# Patient Record
Sex: Male | Born: 1995 | Race: Black or African American | Hispanic: No | Marital: Single | Smoking: Never smoker
Health system: Southern US, Community
[De-identification: ages and names within clinical notes are randomized; demographics above are authoritative.]

## PROBLEM LIST (undated history)

## (undated) DIAGNOSIS — R569 Unspecified convulsions: Secondary | ICD-10-CM

---

## 2019-10-27 ENCOUNTER — Inpatient Hospital Stay
Admit: 2019-10-27 | Discharge: 2019-10-27 | Disposition: A | Discharge status: Home / Self Care | Attending: Emergency Medicine

## 2019-10-27 DIAGNOSIS — G40909 Epilepsy, unspecified, not intractable, without status epilepticus: Secondary | ICD-10-CM

## 2019-10-27 LAB — CBC WITH AUTO DIFFERENTIAL
Basophils %: 0 % (ref 0–1)
Basophils Absolute: 0 10*3/uL (ref 0.0–0.1)
Eosinophils %: 0 % (ref 0–7)
Eosinophils Absolute: 0 10*3/uL (ref 0.0–0.4)
Granulocyte Absolute Count: 0 10*3/uL (ref 0.00–0.04)
Hematocrit: 43.7 % (ref 36.6–50.3)
Hemoglobin: 13.7 g/dL (ref 12.1–17.0)
Immature Granulocytes: 0 % (ref 0.0–0.5)
Lymphocytes %: 7 % — ABNORMAL LOW (ref 12–49)
Lymphocytes Absolute: 0.7 10*3/uL — ABNORMAL LOW (ref 0.8–3.5)
MCH: 22.8 PG — ABNORMAL LOW (ref 26.0–34.0)
MCHC: 31.4 g/dL (ref 30.0–36.5)
MCV: 72.7 FL — ABNORMAL LOW (ref 80.0–99.0)
MPV: 10 FL (ref 8.9–12.9)
Monocytes %: 3 % — ABNORMAL LOW (ref 5–13)
Monocytes Absolute: 0.3 10*3/uL (ref 0.0–1.0)
NRBC Absolute: 0 10*3/uL (ref 0.00–0.01)
Neutrophils %: 90 % — ABNORMAL HIGH (ref 32–75)
Neutrophils Absolute: 8.7 10*3/uL — ABNORMAL HIGH (ref 1.8–8.0)
Nucleated RBCs: 0 PER 100 WBC
Platelets: 275 10*3/uL (ref 150–400)
RBC: 6.01 M/uL — ABNORMAL HIGH (ref 4.10–5.70)
RDW: 13.9 % (ref 11.5–14.5)
WBC: 9.7 10*3/uL (ref 4.1–11.1)

## 2019-10-27 LAB — COMPREHENSIVE METABOLIC PANEL
ALT: 18 U/L (ref 12–78)
AST: 16 U/L (ref 15–37)
Albumin/Globulin Ratio: 1 — ABNORMAL LOW (ref 1.1–2.2)
Albumin: 4.3 g/dL (ref 3.5–5.0)
Alkaline Phosphatase: 63 U/L (ref 45–117)
Anion Gap: 15 mmol/L (ref 5–15)
BUN: 8 MG/DL (ref 6–20)
Bun/Cre Ratio: 5 — ABNORMAL LOW (ref 12–20)
CO2: 23 mmol/L (ref 21–32)
Calcium: 8.9 MG/DL (ref 8.5–10.1)
Chloride: 101 mmol/L (ref 97–108)
Creatinine: 1.57 MG/DL — ABNORMAL HIGH (ref 0.70–1.30)
EGFR IF NonAfrican American: 54 mL/min/{1.73_m2} — ABNORMAL LOW (ref >60)
GFR African American: >60 mL/min/{1.73_m2} (ref >60)
Globulin: 4.1 g/dL — ABNORMAL HIGH (ref 2.0–4.0)
Glucose: 107 mg/dL — ABNORMAL HIGH (ref 65–100)
Potassium: 3.4 mmol/L — ABNORMAL LOW (ref 3.5–5.1)
Sodium: 139 mmol/L (ref 136–145)
Total Bilirubin: 0.4 MG/DL (ref 0.2–1.0)
Total Protein: 8.4 g/dL — ABNORMAL HIGH (ref 6.4–8.2)

## 2019-10-27 LAB — ETHYL ALCOHOL
ALCOHOL(ETHYL),SERUM: <10 MG/DL (ref <10)
Ethyl Alcohol: <10 MG/DL (ref <10)

## 2019-10-27 LAB — CBC WITH AUTOMATED DIFF
ABS. BASOPHILS: 0 10*3/uL (ref 0.0–0.1)
ABS. EOSINOPHILS: 0 10*3/uL (ref 0.0–0.4)
ABS. IMM. GRANS.: 0 10*3/uL (ref 0.00–0.04)
ABS. LYMPHOCYTES: 0.7 10*3/uL — ABNORMAL LOW (ref 0.8–3.5)
ABS. MONOCYTES: 0.3 10*3/uL (ref 0.0–1.0)
ABS. NEUTROPHILS: 8.7 10*3/uL — ABNORMAL HIGH (ref 1.8–8.0)
ABSOLUTE NRBC: 0 10*3/uL (ref 0.00–0.01)
BASOPHILS: 0 % (ref 0–1)
EOSINOPHILS: 0 % (ref 0–7)
HCT: 43.7 % (ref 36.6–50.3)
HGB: 13.7 g/dL (ref 12.1–17.0)
IMMATURE GRANULOCYTES: 0 % (ref 0.0–0.5)
LYMPHOCYTES: 7 % — ABNORMAL LOW (ref 12–49)
MCH: 22.8 PG — ABNORMAL LOW (ref 26.0–34.0)
MCHC: 31.4 g/dL (ref 30.0–36.5)
MCV: 72.7 FL — ABNORMAL LOW (ref 80.0–99.0)
MONOCYTES: 3 % — ABNORMAL LOW (ref 5–13)
MPV: 10 FL (ref 8.9–12.9)
NEUTROPHILS: 90 % — ABNORMAL HIGH (ref 32–75)
NRBC: 0 PER 100 WBC
PLATELET: 275 10*3/uL (ref 150–400)
RBC: 6.01 M/uL — ABNORMAL HIGH (ref 4.10–5.70)
RDW: 13.9 % (ref 11.5–14.5)
WBC: 9.7 10*3/uL (ref 4.1–11.1)

## 2019-10-27 LAB — METABOLIC PANEL, COMPREHENSIVE
A-G Ratio: 1 — ABNORMAL LOW (ref 1.1–2.2)
ALT (SGPT): 18 U/L (ref 12–78)
AST (SGOT): 16 U/L (ref 15–37)
Albumin: 4.3 g/dL (ref 3.5–5.0)
Alk. phosphatase: 63 U/L (ref 45–117)
Anion gap: 15 mmol/L (ref 5–15)
BUN/Creatinine ratio: 5 — ABNORMAL LOW (ref 12–20)
BUN: 8 MG/DL (ref 6–20)
Bilirubin, total: 0.4 MG/DL (ref 0.2–1.0)
CO2: 23 mmol/L (ref 21–32)
Calcium: 8.9 MG/DL (ref 8.5–10.1)
Chloride: 101 mmol/L (ref 97–108)
Creatinine: 1.57 MG/DL — ABNORMAL HIGH (ref 0.70–1.30)
GFR est AA: >60 mL/min/{1.73_m2} (ref >60)
GFR est non-AA: 54 mL/min/{1.73_m2} — ABNORMAL LOW (ref >60)
Globulin: 4.1 g/dL — ABNORMAL HIGH (ref 2.0–4.0)
Glucose: 107 mg/dL — ABNORMAL HIGH (ref 65–100)
Potassium: 3.4 mmol/L — ABNORMAL LOW (ref 3.5–5.1)
Protein, total: 8.4 g/dL — ABNORMAL HIGH (ref 6.4–8.2)
Sodium: 139 mmol/L (ref 136–145)

## 2019-10-27 MED ORDER — POTASSIUM CHLORIDE SR 10 MEQ TAB
10 mEq | ORAL | Status: AC
Start: 2019-10-27 — End: 2019-10-27
  Administered 2019-10-27: 21:00:00 via ORAL

## 2019-10-27 MED ORDER — SODIUM CHLORIDE 0.9 % IV
5005 mg/5 mL | Freq: Once | INTRAVENOUS | Status: AC
Start: 2019-10-27 — End: 2019-10-27
  Administered 2019-10-27: 19:00:00 via INTRAVENOUS

## 2019-10-27 MED ORDER — LEVETIRACETAM 500 MG/5 ML IV SOLN
500 mg/5 mL | Freq: Once | INTRAVENOUS | Status: DC
Start: 2019-10-27 — End: 2019-10-27

## 2019-10-27 MED ORDER — LEVETIRACETAM 500 MG TAB
500 mg | ORAL_TABLET | Freq: Two times a day (BID) | ORAL | 0 refills | Status: AC
Start: 2019-10-27 — End: 2019-11-26

## 2019-10-27 MED ORDER — SODIUM CHLORIDE 0.9% BOLUS IV
0.9 % | Freq: Once | INTRAVENOUS | Status: AC
Start: 2019-10-27 — End: 2019-10-27
  Administered 2019-10-27: 19:00:00 via INTRAVENOUS

## 2019-10-27 MED FILL — LEVETIRACETAM 500 MG/5 ML IV SOLN: 500 mg/5 mL | INTRAVENOUS | Qty: 10

## 2019-10-27 MED FILL — SODIUM CHLORIDE 0.9 % IV: INTRAVENOUS | Qty: 1000

## 2019-10-27 MED FILL — POTASSIUM CHLORIDE SR 10 MEQ TAB: 10 mEq | ORAL | Qty: 4

## 2019-10-27 NOTE — ED Provider Notes (Signed)
EMERGENCY DEPARTMENT HISTORY AND PHYSICAL EXAM      Please note that this dictation was completed with the assistance of "Dragon", the computer voice recognition software. Quite often unanticipated grammatical, syntax, homophones, and other interpretive errors are inadvertently transcribed by the computer software. Please disregard these errors and any errors that have escaped final proofreading. Thank you.    Patient Name: Samuel Rosario  DOB: [birth year]  MRN: [id number]  History of Presenting Illness     Chief Complaint   Patient presents with   ??? Seizure       History Provided By: Patient and EMS    HPI: Samuel Rosario, 23 y.o. male with past medical history as documented below presents to the ED with c/o of recurrent seizures prior to arrival.  According to EMS and his girlfriend, patient had 4 seizures today.  Patient's girlfriend did witness all 4 seizures but able to tell me how long each seizure lasted.  Patient apparently had generalized tonic-clonic seizures.  Patient did have tongue biting but no urinary incontinence.  Patient did have a post ictal state after each seizure.  According to the girlfriend, patient is possibly on antiepileptics but has been noncompliant with that.  Patient reports no complaints currently.  EMS vitals were normal, blood sugar was 149.  Patient denies any recent fever, illnesses. He states his [facility]rolog[facility] at the [facility] and [facility]. He thinks he is supposed to be taking Keppra but he states he never got a Rx for it. Pt denies any other alleviating or exacerbating factors. Additionally, pt specifically denies any recent fever, chills, headache, nausea, vomiting, abdominal pain, CP, SOB, lightheadedness, dizziness, numbness, weakness, lower extremity swelling, heart palpitations, urinary sxs, diarrhea, constipation, melena, hematochezia, cough, or congestion.     There are no other complaints, changes or physical findings pertinent to the HPI at this time.    PCP: None     Past History   Past Medical History:  Seizure disorder    Past Surgical History:  History reviewed. No pertinent surgical history.    Family History:  History reviewed. No pertinent family history.    Social History:  Social History     Tobacco Use   ??? Smoking status: Current Every Day Smoker   ??? Smokeless tobacco: Never Used   Substance Use Topics   ??? Alcohol use: Not Currently   ??? Drug use: Not Currently       Allergies:  No Known Allergies    Current Medications:  No current facility-administered medications on file prior to encounter.      No current outpatient medications on file prior to encounter.     Review of Systems   Review of Systems   Constitutional: Negative.  Negative for chills and fever.   HENT: Negative.  Negative for congestion, facial swelling, rhinorrhea, sore throat, trouble swallowing and voice change.    Eyes: Negative.    Respiratory: Negative.  Negative for apnea, cough, chest tightness, shortness of breath and wheezing.    Cardiovascular: Negative.  Negative for chest pain, palpitations and leg swelling.   Gastrointestinal: Negative.  Negative for abdominal distention, abdominal pain, blood in stool, constipation, diarrhea, nausea and vomiting.   Endocrine: Negative.  Negative for cold intolerance, heat intolerance and polyuria.   Genitourinary: Negative.  Negative for difficulty urinating, dysuria, flank pain, frequency, hematuria and urgency.   Musculoskeletal: Negative.  Negative for arthralgias, back pain, myalgias, neck pain and neck stiffness.   Skin: Negative.  Negative for color  change and rash.   Neurological: Positive for seizures. Negative for dizziness, syncope, facial asymmetry, speech difficulty, weakness, light-headedness, numbness and headaches.   Hematological: Negative.  Does not bruise/bleed easily.   Psychiatric/Behavioral: Negative.  Negative for confusion and self-injury. The patient is not nervous/anxious.        Physical Exam   Physical Exam   Vitals signs and nursing note reviewed.   Constitutional:       Appearance: He is well-developed. He is not toxic-appearing.   HENT:      Head: Normocephalic and atraumatic.      Comments: Right lateral tongue ecchymosis, no active bleeding     Mouth/Throat:      Pharynx: No posterior oropharyngeal erythema.   Eyes:      Conjunctiva/sclera: Conjunctivae normal.      Pupils: Pupils are equal, round, and reactive to light.   Neck:      Musculoskeletal: Normal range of motion.   Cardiovascular:      Rate and Rhythm: Normal rate and regular rhythm.      Heart sounds: Normal heart sounds. No murmur. No friction rub. No gallop.    Pulmonary:      Effort: Pulmonary effort is normal. No respiratory distress.      Breath sounds: Normal breath sounds. No wheezing or rales.   Chest:      Chest wall: No tenderness.   Abdominal:      General: Bowel sounds are normal. There is no distension.      Palpations: Abdomen is soft. There is no mass.      Tenderness: There is no abdominal tenderness. There is no guarding or rebound.   Musculoskeletal: Normal range of motion.         General: No tenderness or deformity.   Skin:     General: Skin is warm.      Findings: No rash.   Neurological:      Mental Status: He is alert and oriented to person, place, and time.      Cranial Nerves: No cranial nerve deficit.      Motor: No abnormal muscle tone.      Coordination: Coordination normal.      Deep Tendon Reflexes: Reflexes normal.   Psychiatric:         Behavior: Behavior is cooperative.         Diagnostic Study Results     Labs -   I have personally reviewed and interpreted all laboratory results.   Recent Results (from the past 24 hour(s))   CBC WITH AUTOMATED DIFF    Collection Time: 10/27/19  1:50 PM   Result Value Ref Range    WBC 9.7 4.1 - 11.1 K/uL    RBC 6.01 (H) 4.10 - 5.70 M/uL    HGB 13.7 12.1 - 17.0 g/dL    HCT 43.7 36.6 - 50.3 %    MCV 72.7 (L) 80.0 - 99.0 FL    MCH 22.8 (L) 26.0 - 34.0 PG    MCHC 31.4 30.0 - 36.5 g/dL     RDW 13.9 11.5 - 14.5 %    PLATELET 275 150 - 400 K/uL    MPV 10.0 8.9 - 12.9 FL    NRBC 0.0 0 PER 100 WBC    ABSOLUTE NRBC 0.00 0.00 - 0.01 K/uL    NEUTROPHILS 90 (H) 32 - 75 %    LYMPHOCYTES 7 (L) 12 - 49 %    MONOCYTES 3 (L) 5 - 13 %  EOSINOPHILS 0 0 - 7 %    BASOPHILS 0 0 - 1 %    IMMATURE GRANULOCYTES 0 0.0 - 0.5 %    ABS. NEUTROPHILS 8.7 (H) 1.8 - 8.0 K/UL    ABS. LYMPHOCYTES 0.7 (L) 0.8 - 3.5 K/UL    ABS. MONOCYTES 0.3 0.0 - 1.0 K/UL    ABS. EOSINOPHILS 0.0 0.0 - 0.4 K/UL    ABS. BASOPHILS 0.0 0.0 - 0.1 K/UL    ABS. IMM. GRANS. 0.0 0.00 - 0.04 K/UL    DF SMEAR SCANNED      RBC COMMENTS NORMOCYTIC, NORMOCHROMIC     METABOLIC PANEL, COMPREHENSIVE    Collection Time: 10/27/19  1:50 PM   Result Value Ref Range    Sodium 139 136 - 145 mmol/L    Potassium 3.4 (L) 3.5 - 5.1 mmol/L    Chloride 101 97 - 108 mmol/L    CO2 23 21 - 32 mmol/L    Anion gap 15 5 - 15 mmol/L    Glucose 107 (H) 65 - 100 mg/dL    BUN 8 6 - 20 MG/DL    Creatinine 1.57 (H) 0.70 - 1.30 MG/DL    BUN/Creatinine ratio 5 (L) 12 - 20      GFR est AA >60 >60 ml/min/1.28m    GFR est non-AA 54 (L) >60 ml/min/1.753m   Calcium 8.9 8.5 - 10.1 MG/DL    Bilirubin, total 0.4 0.2 - 1.0 MG/DL    ALT (SGPT) 18 12 - 78 U/L    AST (SGOT) 16 15 - 37 U/L    Alk. phosphatase 63 45 - 117 U/L    Protein, total 8.4 (H) 6.4 - 8.2 g/dL    Albumin 4.3 3.5 - 5.0 g/dL    Globulin 4.1 (H) 2.0 - 4.0 g/dL    A-G Ratio 1.0 (L) 1.1 - 2.2     ETHYL ALCOHOL    Collection Time: 10/27/19  2:03 PM   Result Value Ref Range    ALCOHOL(ETHYL),SERUM <10 <10 MG/DL       Radiologic Studies -   I have personally reviewed and interpreted all imaging studies and agree with radiology interpretation and report.   No orders to display     CT Results  (Last 48 hours)    None        CXR Results  (Last 48 hours)    None          Medical Decision Making   I reviewed the vital signs, available nursing notes, past medical history, past surgical history, family history and social history.     Vital Signs-Reviewed the patient's vital signs.  Patient Vitals for the past 24 hrs:   Temp Pulse Resp BP SpO2   10/27/19 1353 98.4 ??F (36.9 ??C) 81 16 119/71 97 %       Pulse Oximetry Analysis - 97% on RA    Cardiac Monitor:   Rate: 81 bpm  Rhythm: Normal Sinus Rhythm      Records Reviewed: Nursing Notes, Old Medical Records, Previous electrocardiograms, Previous Radiology Studies and Previous Laboratory Studies    Provider Notes (Medical Decision Making):   Patient presents after a seizure. Currently stable vitals, GCS 15 and no longer post-ictal. DDx: seizure 2/2 noncompliance, infection, increased stressor, electrolyte anomaly (hypoglycemia, etc), ETOH withdrawal. Less likely related to meningitis or brain mass at this time.  Will obtain UA, labs and provide loading dose of antiepileptic.    Given seizure, I did discuss  with the patient about driving restrictions and that he is not allowed to drive for 6 months according to the [facility].    ED Course:   I am the first provider for this patient's ED visit today.  Initial assessment performed. I discussed presenting problems, concerns and my formulated plan for today's visit with the patient and any available family members at bedside. I encouraged them to ask questions as they arise throughout the visit.     TOBACCO COUNSELING:  Upon evaluation, pt expressed that they are a current tobacco user. For approximately 10 minutes, pt has been counseled on the dangers of smoking and was encouraged to quit as soon as possible in order to decrease further risks to their health. Pt has conveyed their understanding of the risks involved should they continue to use tobacco products.    I reviewed our electronic medical record system for any past medical records that were available that may contribute to the patient's current condition, the nursing notes and vital signs from today's visit.  Roena Malady, MD    ED Orders Placed :  Orders Placed This Encounter    ??? CBC WITH AUTOMATED DIFF   ??? METABOLIC PANEL, COMPREHENSIVE   ??? DRUG SCREEN, URINE   ??? BLOOD ALCOHOL (Ethyl Alcohol)   ??? CARDIAC/RESPIRATORY MONITORING   ??? INSERT PERIPHERAL IV ONE TIME STAT   ??? SEIZURE PRECAUTIONS   ??? DISCONTD: levETIRAcetam (KEPPRA) 1,000 mg in 0.9% sodium chloride 100 mL IVPB   ??? sodium chloride 0.9 % bolus infusion 1,000 mL   ??? levETIRAcetam (KEPPRA) 1,000 mg in 0.9% sodium chloride 100 mL IVPB   ??? potassium chloride SR (KLOR-CON 10) tablet 40 mEq   ??? levETIRAcetam (Keppra) 500 mg tablet       ED Medications Administered:  Medications   potassium chloride SR (KLOR-CON 10) tablet 40 mEq (has no administration in time range)   sodium chloride 0.9 % bolus infusion 1,000 mL (1,000 mL IntraVENous New Bag 10/27/19 1413)   levETIRAcetam (KEPPRA) 1,000 mg in 0.9% sodium chloride 100 mL IVPB (0 mg IntraVENous IV Completed 10/27/19 1512)        Progress Note:  Patient has been reassessed and reports feeling better and symptoms have improved significantly after ED treatment. Samuel Rosario's final labs and imaging have been reviewed with him and available family and/or caregiver. They have been counseled regarding his diagnosis. He verbally conveys understanding and agreement of the signs, symptoms, diagnosis, treatment and prognosis and additionally agrees to follow up as recommended with Dr. None and/or specialist in 24 - 48 hours. He also agrees with the care-plan we created together and conveys that all of his questions have been answered.  I have also put together some discharge instructions for him that include: 1) educational information regarding their diagnosis, 2) how to care for their diagnosis at home, as well a 3) list of reasons why they would want to return to the ED prior to their follow-up appointment should the patient's condition change or symptoms worsen.    I have answered all questions to the patient's satisfaction. Strict return  precautions given. He both understood and agreed with plan as discussed. Vital signs stable for discharge. Pt very appreciative of care today.     Disposition: Discharge  The pt is ready for discharge. The pt's signs, symptoms, diagnosis, and discharge instructions have been discussed and pt and/or family have conveyed their understanding. The pt is to follow up as recommended or return to  ER should their symptoms worsen. Plan has been discussed and all parties are in full agreement.    Plan:  1. Return precautions as discussed with patient and available family and/or caregiver.     2.   Current Discharge Medication List      START taking these medications    Details   levETIRAcetam (Keppra) 500 mg tablet Take 1 Tab by mouth two (2) times a day for 30 days.  Qty: 60 Tab, Refills: 0           3.   Follow-up Information     Follow up With Specialties Details [place]    [facility] EMERGENCY DEPT Emergency Medicine  As needed, If symptoms worsen [address]    [facility]  Schedule an appointment as soon as possible for a visit As needed, If symptoms worsen [place]  [place]  [phone]          Instructed to return to ED if worse  Diagnosis   Clinical Impression:  1. Breakthrough seizure ([place])    2. AKI (acute kidney injury) ([place])    3. Medically noncompliant    4. Acute hypokalemia    5. Injury of tongue, initial encounter        Attestation:  I, Fredderick Erb, MD, am the attending of record for this patient. I personally performed the services described in this documentation on this date, 10/27/2019 for patient, Samuel Rosario. I have reviewed the chart and verified that the record is accurate and complete.

## 2019-10-27 NOTE — ED Notes (Signed)
Pt escorted to family friend's car in wheelchair by ED staff.

## 2019-10-27 NOTE — ED Notes (Signed)
Pt presents to ED via EMS complaining of seizure. Pt noted to be restless in bed, does not remember having a seizure, reports headache. Pt denies drug or alcohol use. Pt reports he is not currently taking medications for seizures, reports his last seizure was 2-3 weeks ago. Pt has significant other at bedside. She reports pt had a total of 4 seizures today. Two while he was still in bed, one while standing where she assisted him back to bed. She reports he had another fall in the bathroom that she did not witness. Pt has RR even and unlabored, skin is warm and dry. Assessment completed and pt updated on plan of care.  Call bell in reach, seizure precautions in place, urinal in reach.       Emergency Department Nursing Plan of Care       The Nursing Plan of Care is developed from the Nursing assessment and Emergency Department Attending provider initial evaluation.  The plan of care may be reviewed in the ???ED Provider note???.    The Plan of Care was developed with the following considerations:   Patient / Family readiness to learn indicated by:verbalized understanding  Persons(s) to be included in education: patient and family  Barriers to Learning/Limitations:No    Signed     Amanda N Taylor, RN    10/27/2019   2:23 PM

## 2019-10-27 NOTE — ED Notes (Addendum)
Rn in to discharge pt. Pt visitor noted to be gone. Pt still appears drowsy, reports he will call his girlfriend to come pick him up. Fluids continue to infuse. Pt has no questions at this time. Seizure precautions remain in place, call bell in reach

## 2019-10-27 NOTE — ED Triage Notes (Signed)
CC seizure activity today, typically a patient of [facility], post-ictal on arrival but is alert and answering questions appropriately. Seizure pads placed, placed on monitor. Pt c/o head pain.

## 2019-10-27 NOTE — ED Notes (Signed)
..  Discharge summary and discharge medications reviewed with patient and appropriate educational materials and side effects teaching were provided.  patient  Given 1 paper prescriptions and 0 electronic prescriptions sent to pt's listed pharmacy.Patient verbalized understanding of the importance of discussing medications with his or her physician or clinic they will be following up with. No si/s of acute distress prior to discharge.Patient offered wheelchair from treatment area to hospital entrance, patient discharged with wheelchair.

## 2019-10-27 NOTE — ED Provider Notes (Signed)
ED  Provider Notes by Fredderick Erb, MD at 10/27/19 1354                Author: Fredderick Erb, MD  Service: Emergency Medicine  Author Type: Physician       Filed: 10/28/19 2035  Date of Service: 10/27/19 1354  Status: Signed          Editor: Fredderick Erb, MD (Physician)                EMERGENCY DEPARTMENT HISTORY AND PHYSICAL EXAM           Please note that this dictation was completed with the assistance of "Dragon", the computer voice recognition software. Quite often unanticipated grammatical, syntax, homophones, and other interpretive  errors are inadvertently transcribed by the computer software. Please disregard these errors and any errors that have escaped final proofreading. Thank you.      Patient Name: Samuel Rosario   DOB: Jan 01, 1996   MRN: [id number]     History of Presenting Illness          Chief Complaint       Patient presents with        ?  Seizure           History Provided By: Patient and EMS      HPI: Samuel Rosario,  23 y.o. male with past medical history as documented below presents to the ED with c/o of  recurrent seizures prior to arrival.  According to EMS and his girlfriend, patient had 4 seizures today.  Patient's girlfriend did witness all 4 seizures but able to tell me how long each seizure lasted.  Patient apparently had generalized tonic-clonic  seizures.  Patient did have tongue biting but no urinary incontinence.  Patient did have a post ictal state after each seizure.  According to the girlfriend, patient is possibly on antiepileptics but has been noncompliant with that.  Patient reports no  complaints currently.  EMS vitals were normal, blood sugar was 149.  Patient denies any recent fever, illnesses. He states his Neurologist is at [facility] and [facility]. He thinks he is supposed to be taking Keppra but he states he never got a Rx  for it. Pt denies any other alleviating or exacerbating factors. Additionally, pt specifically denies any recent fever, chills, headache, nausea,  vomiting, abdominal pain, CP, SOB, lightheadedness, dizziness, numbness, weakness, lower extremity swelling,  heart palpitations, urinary sxs, diarrhea, constipation, melena, hematochezia, cough, or congestion.       There are no other complaints, changes or physical findings pertinent to the HPI at this time.      PCP: None        Past History     Past Medical History:   Seizure disorder      Past Surgical History:   History reviewed. No pertinent surgical history.      Family History:   History reviewed. No pertinent family history.      Social History:     Social History          Tobacco Use         ?  Smoking status:  Current Every Day Smoker     ?  Smokeless tobacco:  Never Used       Substance Use Topics         ?  Alcohol use:  Not Currently         ?  Drug use:  Not Currently  Allergies:   No Known Allergies      Current Medications:     No current facility-administered medications on file prior to encounter.           No current outpatient medications on file prior to encounter.          Review of Systems     Review of Systems    Constitutional: Negative.  Negative for chills and fever.    HENT: Negative.  Negative for congestion, facial swelling, rhinorrhea, sore throat, trouble swallowing and voice change.     Eyes: Negative.     Respiratory: Negative.  Negative for apnea, cough, chest tightness, shortness of breath and wheezing.     Cardiovascular: Negative.  Negative for chest pain, palpitations and leg swelling.    Gastrointestinal: Negative.  Negative for abdominal distention, abdominal pain, blood in stool, constipation, diarrhea, nausea and vomiting.    Endocrine: Negative.  Negative for cold intolerance, heat intolerance and polyuria.    Genitourinary: Negative.  Negative for difficulty urinating, dysuria, flank pain, frequency, hematuria and urgency.    Musculoskeletal: Negative.  Negative for arthralgias, back pain, myalgias, neck pain and neck stiffness.    Skin: Negative.  Negative for  color change and rash.    Neurological: Positive for seizures. Negative for dizziness, syncope, facial asymmetry, speech difficulty, weakness, light-headedness, numbness  and headaches.    Hematological: Negative.  Does not bruise/bleed easily.    Psychiatric/Behavioral: Negative.  Negative for confusion and self-injury. The patient is not nervous/anxious.             Physical Exam     Physical Exam   Vitals signs and nursing note reviewed.   Constitutional:        Appearance: He is well-developed. He is not toxic-appearing.    HENT:       Head: Normocephalic and atraumatic.      Comments: Right lateral tongue ecchymosis, no active bleeding     Mouth/Throat:       Pharynx: No posterior oropharyngeal erythema.   Eyes :       Conjunctiva/sclera: Conjunctivae normal.      Pupils: Pupils are equal, round, and reactive to light.    Neck:       Musculoskeletal: Normal range of motion.   Cardiovascular :       Rate and Rhythm: Normal rate and regular rhythm.      Heart sounds: Normal heart sounds. No murmur. No friction rub. No gallop.     Pulmonary:       Effort: Pulmonary effort is normal. No respiratory distress.      Breath sounds: Normal breath sounds. No wheezing or rales.   Chest:       Chest wall: No tenderness.   Abdominal :      General: Bowel sounds are normal. There is no distension.      Palpations: Abdomen is soft. There is no mass.      Tenderness: There is no abdominal tenderness. There is no guarding or rebound.     Musculoskeletal: Normal range of motion.          General: No tenderness or deformity.    Skin:      General: Skin is warm.      Findings: No rash.   Neurological :       Mental Status: He is alert and oriented to person, place, and time.      Cranial Nerves: No cranial nerve deficit.  Motor: No abnormal muscle tone.      Coordination: Coordination normal.      Deep Tendon Reflexes: Reflexes normal.   Psychiatric:          Behavior: Behavior is cooperative.               Diagnostic Study  Results        Labs -    I have personally reviewed and interpreted all laboratory results.      Recent Results (from the past 24 hour(s))     CBC WITH AUTOMATED DIFF          Collection Time: 10/27/19  1:50 PM         Result  Value  Ref Range            WBC  9.7  4.1 - 11.1 K/uL       RBC  6.01 (H)  4.10 - 5.70 M/uL       HGB  13.7  12.1 - 17.0 g/dL       HCT  43.7  36.6 - 50.3 %       MCV  72.7 (L)  80.0 - 99.0 FL       MCH  22.8 (L)  26.0 - 34.0 PG       MCHC  31.4  30.0 - 36.5 g/dL       RDW  13.9  11.5 - 14.5 %       PLATELET  275  150 - 400 K/uL       MPV  10.0  8.9 - 12.9 FL       NRBC  0.0  0 PER 100 WBC       ABSOLUTE NRBC  0.00  0.00 - 0.01 K/uL       NEUTROPHILS  90 (H)  32 - 75 %       LYMPHOCYTES  7 (L)  12 - 49 %       MONOCYTES  3 (L)  5 - 13 %       EOSINOPHILS  0  0 - 7 %       BASOPHILS  0  0 - 1 %       IMMATURE GRANULOCYTES  0  0.0 - 0.5 %       ABS. NEUTROPHILS  8.7 (H)  1.8 - 8.0 K/UL       ABS. LYMPHOCYTES  0.7 (L)  0.8 - 3.5 K/UL       ABS. MONOCYTES  0.3  0.0 - 1.0 K/UL       ABS. EOSINOPHILS  0.0  0.0 - 0.4 K/UL       ABS. BASOPHILS  0.0  0.0 - 0.1 K/UL       ABS. IMM. GRANS.  0.0  0.00 - 0.04 K/UL       DF  SMEAR SCANNED          RBC COMMENTS  NORMOCYTIC, NORMOCHROMIC          METABOLIC PANEL, COMPREHENSIVE          Collection Time: 10/27/19  1:50 PM         Result  Value  Ref Range            Sodium  139  136 - 145 mmol/L       Potassium  3.4 (L)  3.5 - 5.1 mmol/L       Chloride  101  97 - 108 mmol/L  CO2  23  21 - 32 mmol/L       Anion gap  15  5 - 15 mmol/L       Glucose  107 (H)  65 - 100 mg/dL       BUN  8  6 - 20 MG/DL       Creatinine  1.57 (H)  0.70 - 1.30 MG/DL       BUN/Creatinine ratio  5 (L)  12 - 20         GFR est AA  >60  >60 ml/min/1.74m       GFR est non-AA  54 (L)  >60 ml/min/1.720m      Calcium  8.9  8.5 - 10.1 MG/DL       Bilirubin, total  0.4  0.2 - 1.0 MG/DL            ALT (SGPT)  18  12 - 78 U/L            AST (SGOT)  16  15 - 37 U/L       Alk. phosphatase  63   45 - 117 U/L       Protein, total  8.4 (H)  6.4 - 8.2 g/dL       Albumin  4.3  3.5 - 5.0 g/dL       Globulin  4.1 (H)  2.0 - 4.0 g/dL       A-G Ratio  1.0 (L)  1.1 - 2.2         ETHYL ALCOHOL          Collection Time: 10/27/19  2:03 PM         Result  Value  Ref Range            ALCOHOL(ETHYL),SERUM  <10  <10 MG/DL           Radiologic Studies -    I have personally reviewed and interpreted all imaging studies and agree with radiology interpretation and report.      No orders to display          CT Results   (Last 48 hours)          None                 CXR Results   (Last 48 hours)          None                    Medical Decision Making     I reviewed the vital signs, available nursing notes, past medical history, past surgical history, family history and social history.      Vital Signs-Reviewed the patient's vital signs.   Patient Vitals for the past 24 hrs:            Temp  Pulse  Resp  BP  SpO2            10/27/19 1353  98.4 ??F (36.9 ??C)  81  16  119/71  97 %           Pulse Oximetry Analysis - 97% on RA      Cardiac Monitor:    Rate: 81 bpm   Rhythm: Normal Sinus Rhythm        Records Reviewed: Nursing Notes, Old Medical Records, Previous electrocardiograms, Previous Radiology Studies and Previous Laboratory Studies      Provider Notes (Medical Decision Making):    Patient presents  after a seizure. Currently stable vitals, GCS 15 and no longer post-ictal.  DDx: seizure 2/2 noncompliance, infection, increased stressor, electrolyte anomaly (hypoglycemia, etc), ETOH withdrawal. Less likely related to meningitis or brain mass at this time.  Will obtain UA, labs and provide loading dose of antiepileptic.      Given seizure, I did discuss with the patient  about driving restrictions and that he is not allowed to drive for 6 months according to the [place].      ED Course:    I am the first provider for this patient's ED visit today.  Initial assessment performed. I discussed presenting problems, concerns and  my formulated plan for today's visit with the patient and any available family members at bedside. I encouraged them  to ask questions as they arise throughout the visit.       TOBACCO COUNSELING:   Upon evaluation, pt expressed that they are a current tobacco user. For approximately 10 minutes, pt has been counseled on the dangers of smoking and was encouraged to quit as soon as possible in order  to decrease further risks to their health. Pt has conveyed their understanding of the risks involved should they continue to use tobacco products.      I reviewed our electronic medical record system for any past medical records that were available that may contribute to the patient's current condition, the nursing notes and vital signs from  today's visit.  Roena Malady, MD      ED Orders Placed :     Orders Placed This Encounter        ?  CBC WITH AUTOMATED DIFF     ?  METABOLIC PANEL, COMPREHENSIVE     ?  DRUG SCREEN, URINE     ?  BLOOD ALCOHOL (Ethyl Alcohol)     ?  CARDIAC/RESPIRATORY MONITORING     ?  INSERT PERIPHERAL IV ONE TIME STAT     ?  SEIZURE PRECAUTIONS     ?  DISCONTD: levETIRAcetam (KEPPRA) 1,000 mg in 0.9% sodium chloride 100 mL IVPB     ?  sodium chloride 0.9 % bolus infusion 1,000 mL     ?  levETIRAcetam (KEPPRA) 1,000 mg in 0.9% sodium chloride 100 mL IVPB     ?  potassium chloride SR (KLOR-CON 10) tablet 40 mEq        ?  levETIRAcetam (Keppra) 500 mg tablet           ED Medications Administered:     Medications       potassium chloride SR (KLOR-CON 10) tablet 40 mEq (has no administration in time range)     sodium chloride 0.9 % bolus infusion 1,000 mL (1,000 mL IntraVENous New Bag 10/27/19 1413)       levETIRAcetam (KEPPRA) 1,000 mg in 0.9% sodium chloride 100 mL IVPB (0 mg IntraVENous IV Completed 10/27/19 1512)            Progress Note:   Patient has been reassessed and reports feeling better and symptoms have improved significantly after ED treatment. Samuel Rosario 's final labs and imaging have been  reviewed with him and available family and/or caregiver. They have been counseled regarding  his diagnosis. He verbally conveys understanding and agreement of the signs, symptoms, diagnosis,  treatment and prognosis and additionally agrees to follow up as recommended with Dr. None and/or specialist in 24 - 48 hours.  He also agrees with the care-plan we created together and conveys that  all of his questions  have been answered.  I have also put together some discharge instructions for him that include: 1) educational information regarding their diagnosis,  2) how to care for their diagnosis at home, as well a 3) list of reasons why they would want to return to the ED prior to their follow-up appointment should the patient's condition change or symptoms worsen.      I have answered all questions to the patient's satisfaction. Strict return precautions given. He both understood and agreed with plan as discussed. Vital signs stable for discharge. Pt very  appreciative of care today.       Disposition: Discharge   The pt is ready for discharge. The pt's signs, symptoms, diagnosis, and discharge instructions have been discussed and pt and/or family have conveyed their understanding. The pt is to follow up as recommended  or return to ER should their symptoms worsen. Plan has been discussed and all parties are in full agreement.      Plan:   1. Return precautions as discussed with patient and available family and/or caregiver.       2.      Current Discharge Medication List              START taking these medications          Details        levETIRAcetam (Keppra) 500 mg tablet  Take 1 Tab by mouth two (2) times a day for 30 days.   Qty: 60 Tab, Refills:  0                      3.      Follow-up Information               Follow up With  Specialties  Details  Why  [place]              [facility] EMERGENCY DEPT  Emergency Medicine    As needed, If symptoms worsen  [address]               [facility]    Schedule an appointment as soon as possible for a visit  As needed, If symptoms worsen  [place]   [place]   [phone]                Instructed to return to ED if worse     Diagnosis     Clinical Impression:      1.  Breakthrough seizure ([place])      2.  AKI (acute kidney injury) ([place])      3.  Medically noncompliant      4.  Acute hypokalemia         5.  Injury of tongue, initial encounter            Attestation:   I, Fredderick Erb, MD, am the attending of record for this patient. I personally performed the services described in this documentation on this date, 10/27/2019 for patient, Samuel Rosario. I have  reviewed the chart and verified that the record is accurate and complete.

## 2019-10-27 NOTE — ED Notes (Signed)
 Pt presents to ED via EMS complaining of seizure. Pt noted to be restless in bed, does not remember having a seizure, reports headache. Pt denies drug or alcohol use. Pt reports he is not currently taking medications for seizures, reports his last seizure was 2-3 weeks ago. Pt has significant other at bedside. She reports pt had a total of 4 seizures today. Two while he was still in bed, one while standing where she assisted him back to bed. She reports he had another fall in the bathroom that she did not witness. Pt has RR even and unlabored, skin is warm and dry. Assessment completed and pt updated on plan of care.  Call bell in reach, seizure precautions in place, urinal in reach.       Emergency Department Nursing Plan of Care       The Nursing Plan of Care is developed from the Nursing assessment and Emergency Department Attending provider initial evaluation.  The plan of care may be reviewed in the "ED Provider note".    The Plan of Care was developed with the following considerations:   Patient / Family readiness to learn indicated ab:czmajopszi understanding  Persons(s) to be included in education: patient and family  Barriers to Learning/Limitations:No    Signed     Alan LOISE [place], RN    10/27/2019   2:23 PM

## 2019-10-27 NOTE — ED Notes (Signed)
Pt escorted to family friend's car in wheelchair by ED staff.

## 2019-10-27 NOTE — ED Notes (Signed)
Rn in to discharge pt. Pt visitor noted to be gone. Pt still appears drowsy, reports he will call his girlfriend to come pick him up. Fluids continue to infuse. Pt has no questions at this time. Seizure precautions remain in place, call bell in reach

## 2019-10-27 NOTE — ED Notes (Signed)
CC seizure activity today, typically a patient of [facility], post-ictal on arrival but is alert and answering questions appropriately. Seizure pads placed, placed on monitor. Pt c/o head pain.

## 2020-03-28 ENCOUNTER — Inpatient Hospital Stay: Admit: 2020-03-28 | Discharge: 2020-03-28 | Attending: Emergency Medicine

## 2020-03-28 DIAGNOSIS — G40909 Epilepsy, unspecified, not intractable, without status epilepticus: Secondary | ICD-10-CM

## 2020-03-28 NOTE — ED Notes (Signed)
Pt called to treatment room x 3 by provider, no answer

## 2020-03-28 NOTE — ED Triage Notes (Cosign Needed)
Patient complains of have seven seizures since yesterday. Last seizure reported an hour ago. Pt reports that he is on seizure medication (Keepra) and has been compliant. Complains of pain in head and neck and feeling "stiff"

## 2020-03-28 NOTE — ED Notes (Signed)
Pt called to treatment room x 3 by provider, no answer

## 2020-03-28 NOTE — ED Notes (Deleted)
Patient complains of have seven seizures since yesterday. Last seizure reported an hour ago. Pt reports that he is on seizure medication Marthann Schiller) and has been compliant. Complains of pain in head and neck and feeling "stiff"

## 2020-05-01 ENCOUNTER — Emergency Department: Admit: 2020-05-01

## 2020-05-01 ENCOUNTER — Inpatient Hospital Stay: Admit: 2020-05-01 | Discharge: 2020-05-02 | Discharge status: Against Medical Advice | Attending: Emergency Medicine

## 2020-05-01 DIAGNOSIS — G40909 Epilepsy, unspecified, not intractable, without status epilepticus: Secondary | ICD-10-CM

## 2020-05-01 LAB — COMPREHENSIVE METABOLIC PANEL
ALT: 24 U/L (ref 12–78)
AST: 32 U/L (ref 15–37)
Albumin/Globulin Ratio: 1.2 (ref 1.1–2.2)
Albumin: 4.4 g/dL (ref 3.5–5.0)
Alkaline Phosphatase: 74 U/L (ref 45–117)
Anion Gap: 10 mmol/L (ref 5–15)
BUN: 7 MG/DL (ref 6–20)
Bun/Cre Ratio: 5 — ABNORMAL LOW (ref 12–20)
CO2: 21 mmol/L (ref 21–32)
Calcium: 9.1 MG/DL (ref 8.5–10.1)
Chloride: 106 mmol/L (ref 97–108)
Creatinine: 1.33 MG/DL — ABNORMAL HIGH (ref 0.70–1.30)
EGFR IF NonAfrican American: >60 mL/min/{1.73_m2} (ref >60)
GFR African American: >60 mL/min/{1.73_m2} (ref >60)
Globulin: 3.8 g/dL (ref 2.0–4.0)
Glucose: 87 mg/dL (ref 65–100)
Potassium: 3.5 mmol/L (ref 3.5–5.1)
Sodium: 137 mmol/L (ref 136–145)
Total Bilirubin: 0.5 MG/DL (ref 0.2–1.0)
Total Protein: 8.2 g/dL (ref 6.4–8.2)

## 2020-05-01 LAB — CBC WITH AUTO DIFFERENTIAL
Basophils %: 0 % (ref 0–1)
Basophils Absolute: 0 10*3/uL (ref 0.0–0.1)
Eosinophils %: 0 % (ref 0–7)
Eosinophils Absolute: 0 10*3/uL (ref 0.0–0.4)
Granulocyte Absolute Count: 0.1 10*3/uL — ABNORMAL HIGH (ref 0.00–0.04)
Hematocrit: 43.9 % (ref 36.6–50.3)
Hemoglobin: 13.6 g/dL (ref 12.1–17.0)
Immature Granulocytes: 1 % — ABNORMAL HIGH (ref 0.0–0.5)
Lymphocytes %: 5 % — ABNORMAL LOW (ref 12–49)
Lymphocytes Absolute: 0.7 10*3/uL — ABNORMAL LOW (ref 0.8–3.5)
MCH: 22.9 PG — ABNORMAL LOW (ref 26.0–34.0)
MCHC: 31 g/dL (ref 30.0–36.5)
MCV: 74 FL — ABNORMAL LOW (ref 80.0–99.0)
MPV: 10.7 FL (ref 8.9–12.9)
Monocytes %: 5 % (ref 5–13)
Monocytes Absolute: 0.7 10*3/uL (ref 0.0–1.0)
NRBC Absolute: 0 10*3/uL (ref 0.00–0.01)
Neutrophils %: 89 % — ABNORMAL HIGH (ref 32–75)
Neutrophils Absolute: 11.6 10*3/uL — ABNORMAL HIGH (ref 1.8–8.0)
Nucleated RBCs: 0 PER 100 WBC
Platelets: 282 10*3/uL (ref 150–400)
RBC: 5.93 M/uL — ABNORMAL HIGH (ref 4.10–5.70)
RDW: 14.7 % — ABNORMAL HIGH (ref 11.5–14.5)
WBC: 13.1 10*3/uL — ABNORMAL HIGH (ref 4.1–11.1)

## 2020-05-01 LAB — ETHYL ALCOHOL
ALCOHOL(ETHYL),SERUM: <10 MG/DL (ref <10)
Ethyl Alcohol: <10 MG/DL (ref <10)

## 2020-05-01 LAB — CBC WITH AUTOMATED DIFF
ABS. BASOPHILS: 0 10*3/uL (ref 0.0–0.1)
ABS. EOSINOPHILS: 0 10*3/uL (ref 0.0–0.4)
ABS. IMM. GRANS.: 0.1 10*3/uL — ABNORMAL HIGH (ref 0.00–0.04)
ABS. LYMPHOCYTES: 0.7 10*3/uL — ABNORMAL LOW (ref 0.8–3.5)
ABS. MONOCYTES: 0.7 10*3/uL (ref 0.0–1.0)
ABS. NEUTROPHILS: 11.6 10*3/uL — ABNORMAL HIGH (ref 1.8–8.0)
ABSOLUTE NRBC: 0 10*3/uL (ref 0.00–0.01)
BASOPHILS: 0 % (ref 0–1)
EOSINOPHILS: 0 % (ref 0–7)
HCT: 43.9 % (ref 36.6–50.3)
HGB: 13.6 g/dL (ref 12.1–17.0)
IMMATURE GRANULOCYTES: 1 % — ABNORMAL HIGH (ref 0.0–0.5)
LYMPHOCYTES: 5 % — ABNORMAL LOW (ref 12–49)
MCH: 22.9 PG — ABNORMAL LOW (ref 26.0–34.0)
MCHC: 31 g/dL (ref 30.0–36.5)
MCV: 74 FL — ABNORMAL LOW (ref 80.0–99.0)
MONOCYTES: 5 % (ref 5–13)
MPV: 10.7 FL (ref 8.9–12.9)
NEUTROPHILS: 89 % — ABNORMAL HIGH (ref 32–75)
NRBC: 0 PER 100 WBC
PLATELET: 282 10*3/uL (ref 150–400)
RBC: 5.93 M/uL — ABNORMAL HIGH (ref 4.10–5.70)
RDW: 14.7 % — ABNORMAL HIGH (ref 11.5–14.5)
WBC: 13.1 10*3/uL — ABNORMAL HIGH (ref 4.1–11.1)

## 2020-05-01 LAB — METABOLIC PANEL, COMPREHENSIVE
A-G Ratio: 1.2 (ref 1.1–2.2)
ALT (SGPT): 24 U/L (ref 12–78)
AST (SGOT): 32 U/L (ref 15–37)
Albumin: 4.4 g/dL (ref 3.5–5.0)
Alk. phosphatase: 74 U/L (ref 45–117)
Anion gap: 10 mmol/L (ref 5–15)
BUN/Creatinine ratio: 5 — ABNORMAL LOW (ref 12–20)
BUN: 7 MG/DL (ref 6–20)
Bilirubin, total: 0.5 MG/DL (ref 0.2–1.0)
CO2: 21 mmol/L (ref 21–32)
Calcium: 9.1 MG/DL (ref 8.5–10.1)
Chloride: 106 mmol/L (ref 97–108)
Creatinine: 1.33 MG/DL — ABNORMAL HIGH (ref 0.70–1.30)
GFR est AA: >60 mL/min/{1.73_m2} (ref >60)
GFR est non-AA: >60 mL/min/{1.73_m2} (ref >60)
Globulin: 3.8 g/dL (ref 2.0–4.0)
Glucose: 87 mg/dL (ref 65–100)
Potassium: 3.5 mmol/L (ref 3.5–5.1)
Protein, total: 8.2 g/dL (ref 6.4–8.2)
Sodium: 137 mmol/L (ref 136–145)

## 2020-05-01 LAB — SAMPLES BEING HELD

## 2020-05-01 MED ORDER — KETOROLAC TROMETHAMINE 30 MG/ML INJECTION
30 mg/mL (1 mL) | INTRAMUSCULAR | Status: AC
Start: 2020-05-01 — End: 2020-05-01
  Administered 2020-05-01: 23:00:00 via INTRAVENOUS

## 2020-05-01 MED ORDER — DIPHENHYDRAMINE HCL 50 MG/ML IJ SOLN
50 mg/mL | INTRAMUSCULAR | Status: AC
Start: 2020-05-01 — End: 2020-05-01
  Administered 2020-05-01: 23:00:00 via INTRAVENOUS

## 2020-05-01 MED ORDER — SODIUM CHLORIDE 0.9% BOLUS IV
0.9 % | Freq: Once | INTRAVENOUS | Status: AC
Start: 2020-05-01 — End: 2020-05-01
  Administered 2020-05-02: 01:00:00 via INTRAVENOUS

## 2020-05-01 MED ORDER — SODIUM CHLORIDE 0.9% BOLUS IV
0.9 % | Freq: Once | INTRAVENOUS | Status: AC
Start: 2020-05-01 — End: 2020-05-01
  Administered 2020-05-01: 23:00:00 via INTRAVENOUS

## 2020-05-01 MED ORDER — PROCHLORPERAZINE EDISYLATE 5 MG/ML INJECTION
5 mg/mL | INTRAMUSCULAR | Status: DC
Start: 2020-05-01 — End: 2020-05-01
  Administered 2020-05-01: 23:00:00 via INTRAVENOUS

## 2020-05-01 MED ORDER — LEVETIRACETAM 500 MG/5 ML IV SOLN
5005 mg/5 mL | INTRAVENOUS | Status: AC
Start: 2020-05-01 — End: 2020-05-02

## 2020-05-01 MED ORDER — PROCHLORPERAZINE EDISYLATE 5 MG/ML INJECTION
5 mg/mL | INTRAMUSCULAR | Status: AC
Start: 2020-05-01 — End: 2020-05-01
  Administered 2020-05-01: 23:00:00 via INTRAVENOUS

## 2020-05-01 MED ORDER — LORAZEPAM 2 MG/ML IJ SOLN
2 mg/mL | INTRAMUSCULAR | Status: AC
Start: 2020-05-01 — End: 2020-05-01
  Administered 2020-05-01: 23:00:00 via INTRAVENOUS

## 2020-05-01 MED FILL — LORAZEPAM 2 MG/ML IJ SOLN: 2 mg/mL | INTRAMUSCULAR | Qty: 1

## 2020-05-01 MED FILL — PROCHLORPERAZINE EDISYLATE 5 MG/ML INJECTION: 5 mg/mL | INTRAMUSCULAR | Qty: 2

## 2020-05-01 MED FILL — DIPHENHYDRAMINE HCL 50 MG/ML IJ SOLN: 50 mg/mL | INTRAMUSCULAR | Qty: 1

## 2020-05-01 MED FILL — SODIUM CHLORIDE 0.9 % IV: INTRAVENOUS | Qty: 1000

## 2020-05-01 MED FILL — KETOROLAC TROMETHAMINE 30 MG/ML INJECTION: 30 mg/mL (1 mL) | INTRAMUSCULAR | Qty: 1

## 2020-05-01 MED FILL — LEVETIRACETAM 500 MG/5 ML IV SOLN: 500 mg/5 mL | INTRAVENOUS | Qty: 10

## 2020-05-01 NOTE — ED Triage Notes (Signed)
PT arrives via EMS d/t his significant other calling 911 and reporting that he had 9 seizures. She is unsure what medication he takes but Keppra sounded familiar to her. He was incontinent of bowel and bladder today.

## 2020-05-01 NOTE — ED Notes (Signed)
Provider made aware of keppra and first bolus issue. Per provider, he will order more. Will continue to monitor.

## 2020-05-01 NOTE — ED Notes (Signed)
Patient's wife Samuel Rosario updated regarding patient. Her phone number for updates and questions is [phone].

## 2020-05-01 NOTE — ED Notes (Signed)
Patient refusing to keep cardiac, BP or pulse oximetry on. Wife at bedside. Will continue to monitor.

## 2020-05-01 NOTE — ED Provider Notes (Signed)
24 year old male with a history of PTSD and seizures was brought in after 9 reported seizures.  His significant other was here before I could see the patient, but she had children with her and had to get home.  The patient tells me he has a terrible headache and that he gets these after his seizures.  He says he only has seizures rarely and that he missed the day of his Keppra.  He does not believe he injured his head during any of these seizures and he does not believe he bit his tongue.  He showed me his tongue and that there were no lesions.  There was report of incontinence.  Patient says he was in his normal state of health prior to this and he is not very conversive.  His bizarre behavior prompted me to ask several questions regarding mental health that he tells me he is not depressed or suicidal or homicidal.  He tells me he has not been on any drugs or alcohol.  No hallucinations.  He says when he was in the [employer] he spent time in a psychiatric hospital, but none recently.           Past Medical History:   Diagnosis Date   ??? PTSD (post-traumatic stress disorder)    ??? Seizures (HCC)        Past Surgical History:   Procedure Laterality Date   ??? HX OTHER SURGICAL      mass removal from chest          History reviewed. No pertinent family history.    Social History     Socioeconomic History   ??? Marital status: SINGLE     Spouse name: Not on file   ??? Number of children: Not on file   ??? Years of education: Not on file   ??? Highest education level: Not on file   Occupational History   ??? Not on file   Social Needs   ??? Financial resource strain: Not on file   ??? Food insecurity     Worry: Not on file     Inability: Not on file   ??? Transportation needs     Medical: Not on file     Non-medical: Not on file   Tobacco Use   ??? Smoking status: Current Every Day Smoker   ??? Smokeless tobacco: Never Used   Substance and Sexual Activity   ??? Alcohol use: Not Currently   ??? Drug use: Not Currently   ??? Sexual activity: Yes      Partners: Female   Lifestyle   ??? Physical activity     Days per week: Not on file     Minutes per session: Not on file   ??? Stress: Not on file   Relationships   ??? Social Wellsite geologist on phone: Not on file     Gets together: Not on file     Attends religious service: Not on file     Active member of club or organization: Not on file     Attends meetings of clubs or organizations: Not on file     Relationship status: Not on file   ??? Intimate partner violence     Fear of current or ex partner: Not on file     Emotionally abused: Not on file     Physically abused: Not on file     Forced sexual activity: Not on file   Other Topics Concern   ???  Not on file   Social History Narrative   ??? Not on file         ALLERGIES: Patient has no known allergies.    Review of Systems   Constitutional: Negative for fever.   HENT: Negative for trouble swallowing.    Eyes: Positive for photophobia. Negative for visual disturbance.   Respiratory: Negative for cough.    Cardiovascular: Negative for chest pain.   Gastrointestinal: Negative for abdominal pain.   Genitourinary: Negative for difficulty urinating.   Musculoskeletal: Negative for gait problem.   Skin: Negative for rash.   Neurological: Positive for seizures and headaches.   Hematological: Does not bruise/bleed easily.   Psychiatric/Behavioral: Negative for sleep disturbance.       Vitals:    05/01/20 1743   BP: 91/79   Pulse: 77   Resp: 16   Temp: 98 ??F (36.7 ??C)   SpO2: 98%            Physical Exam  Constitutional:       Appearance: Normal appearance.   HENT:      Head: Normocephalic.      Nose: Nose normal.      Mouth/Throat:      Mouth: Mucous membranes are moist.   Eyes:      Extraocular Movements: Extraocular movements intact.      Conjunctiva/sclera: Conjunctivae normal.   Cardiovascular:      Rate and Rhythm: Normal rate.   Pulmonary:      Effort: Pulmonary effort is normal. No respiratory distress.   Abdominal:      General: Abdomen is flat.   Musculoskeletal:  Normal range of motion.   Skin:     Findings: No rash.   Neurological:      General: No focal deficit present.      Mental Status: He is alert and oriented to person, place, and time.      Cranial Nerves: No cranial nerve deficit, dysarthria or facial asymmetry.      Sensory: No sensory deficit.      Motor: No weakness or seizure activity.      Coordination: Coordination is intact.      Comments: Strange behavior, but will answer questions correctly and appropriately   Psychiatric:         Mood and Affect: Affect is inappropriate.         Behavior: Behavior is cooperative.         Thought Content: Thought content is not paranoid. Thought content does not include homicidal or suicidal ideation.          MDM  Number of Diagnoses or Management Options  Post-ictal state [facility])  Renal insufficiency  Seizure disorder [facility])  Diagnosis management comments: Patient significant other, Kamryn Gauthier [phone]), was in the room with him when I just went to reassess him as she tells me she has no psychiatric concerns.  I had initially gotten the be smart representative, Payton, to see him because his behavior is so bizarre and I wondered whether there was a psychiatric component.  After speaking with the significant other, it seems this is his typical postictal state.  It seems to be lasting longer than it should.  I gave him Keppra and sent off for a level before doing so.  He was hoping he would wake up and be ready to go home, but he has now been here for over 6 hours and is still quite somnolent.    [facility] Consult for Admission  11:21 PM    ED Room Number: ER19/19  Patient Name and age:  Samuel Rosario 24 y.o.  male  Working Diagnosis: Seizure disorder (HCC)  (primary encounter diagnosis)  Post-ictal state (HCC)  Renal insufficiency    COVID-19 Suspicion:  no  Sepsis present:  no  Reassessment needed: no  Code Status:  Full Code  Readmission: no  Isolation Requirements:  no  Recommended Level of Care:  med/surg   Department:[facility] Adult ED - [phone]  Other: Seizure disorder patient who came in postictal after 9 seizures and has still not come back to his baseline.  I gave him Keppra and consulted neurology.  His significant other, phone number in the chart, says he sometimes can have a prolonged postictal state.           Procedures

## 2020-05-01 NOTE — ED Notes (Signed)
Provider at bedside.

## 2020-05-01 NOTE — ED Notes (Signed)
Patient wife at bedside. Urine sent. Will continue to monitor.

## 2020-05-01 NOTE — ED Notes (Signed)
Discontinued keppra and first bolus of fluids, noticed bed was wet. Do not think patient got either medication as to they were not on the correct portion of the IV and one side was open. Changed patient's bed. Patient woke up stating that he is cold and hurting all over. Patient stood up at the end of the bed without issue. Bed cleaned. Dry warm blankets applied to patient. Will notify provider regarding medications.

## 2020-05-01 NOTE — Other (Signed)
Attempted to assess the patient, however he was not able to be aroused.  The patient's wife stated that he presents this way after seizures.  She reported that she did not have any concerns about his mental health.  Per Dr. Ralene Cork, the [facility] consult will be discontinued.    Audie Box, MEd, [facility], Resident in Counseling [phone]

## 2020-05-01 NOTE — ED Notes (Signed)
Discontinued keppra and first bolus of fluids, noticed bed was wet. Do not think patient got either medication as to they were not on the correct portion of the IV and one side was open. Changed patient's bed. Patient woke up stating that he is cold and hurting all over. Patient stood up at the end of the bed without issue. Bed cleaned. Dry warm blankets applied to patient. Will notify provider regarding medications.

## 2020-05-01 NOTE — ED Notes (Signed)
PT arrives via EMS d/t his significant other calling 911 and reporting that he had 9 seizures. She is unsure what medication he takes but Keppra sounded familiar to her. He was incontinent of bowel and bladder today.

## 2020-05-01 NOTE — ED Provider Notes (Signed)
24 year old male with a history of PTSD and seizures was brought in after 9 reported seizures.  His significant other was here before I could see the patient, but she had children with her and had to get home.  The patient tells me he has a terrible headache and that he gets these after his seizures.  He says he only has seizures rarely and that he missed the day of his Keppra.  He does not believe he injured his head during any of these seizures and he does not believe he bit his tongue.  He showed me his tongue and that there were no lesions.  There was report of incontinence.  Patient says he was in his normal state of health prior to this and he is not very conversive.  His bizarre behavior prompted me to ask several questions regarding mental health that he tells me he is not depressed or suicidal or homicidal.  He tells me he has not been on any drugs or alcohol.  No hallucinations.  He says when he was in the [employer] he spent time in a psychiatric hospital, but none recently.           Past Medical History:   Diagnosis Date   ??? PTSD (post-traumatic stress disorder)    ??? Seizures (HCC)        Past Surgical History:   Procedure Laterality Date   ??? HX OTHER SURGICAL      mass removal from chest          History reviewed. No pertinent family history.    Social History     Socioeconomic History   ??? Marital status: SINGLE     Spouse name: Not on file   ??? Number of children: Not on file   ??? Years of education: Not on file   ??? Highest education level: Not on file   Occupational History   ??? Not on file   Social Needs   ??? Financial resource strain: Not on file   ??? Food insecurity     Worry: Not on file     Inability: Not on file   ??? Transportation needs     Medical: Not on file     Non-medical: Not on file   Tobacco Use   ??? Smoking status: Current Every Day Smoker   ??? Smokeless tobacco: Never Used   Substance and Sexual Activity   ??? Alcohol use: Not Currently   ??? Drug use: Not Currently   ??? Sexual activity: Yes      Partners: Female   Lifestyle   ??? Physical activity     Days per week: Not on file     Minutes per session: Not on file   ??? Stress: Not on file   Relationships   ??? Social Wellsite geologist on phone: Not on file     Gets together: Not on file     Attends religious service: Not on file     Active member of club or organization: Not on file     Attends meetings of clubs or organizations: Not on file     Relationship status: Not on file   ??? Intimate partner violence     Fear of current or ex partner: Not on file     Emotionally abused: Not on file     Physically abused: Not on file     Forced sexual activity: Not on file   Other Topics Concern   ???  Not on file   Social History Narrative   ??? Not on file         ALLERGIES: Patient has no known allergies.    Review of Systems   Constitutional: Negative for fever.   HENT: Negative for trouble swallowing.    Eyes: Positive for photophobia. Negative for visual disturbance.   Respiratory: Negative for cough.    Cardiovascular: Negative for chest pain.   Gastrointestinal: Negative for abdominal pain.   Genitourinary: Negative for difficulty urinating.   Musculoskeletal: Negative for gait problem.   Skin: Negative for rash.   Neurological: Positive for seizures and headaches.   Hematological: Does not bruise/bleed easily.   Psychiatric/Behavioral: Negative for sleep disturbance.       Vitals:    05/01/20 1743   BP: 91/79   Pulse: 77   Resp: 16   Temp: 98 ??F (36.7 ??C)   SpO2: 98%            Physical Exam  Constitutional:       Appearance: Normal appearance.   HENT:      Head: Normocephalic.      Nose: Nose normal.      Mouth/Throat:      Mouth: Mucous membranes are moist.   Eyes:      Extraocular Movements: Extraocular movements intact.      Conjunctiva/sclera: Conjunctivae normal.   Cardiovascular:      Rate and Rhythm: Normal rate.   Pulmonary:      Effort: Pulmonary effort is normal. No respiratory distress.   Abdominal:      General: Abdomen is flat.   Musculoskeletal:  Normal range of motion.   Skin:     Findings: No rash.   Neurological:      General: No focal deficit present.      Mental Status: He is alert and oriented to person, place, and time.      Cranial Nerves: No cranial nerve deficit, dysarthria or facial asymmetry.      Sensory: No sensory deficit.      Motor: No weakness or seizure activity.      Coordination: Coordination is intact.      Comments: Strange behavior, but will answer questions correctly and appropriately   Psychiatric:         Mood and Affect: Affect is inappropriate.         Behavior: Behavior is cooperative.         Thought Content: Thought content is not paranoid. Thought content does not include homicidal or suicidal ideation.          MDM  Number of Diagnoses or Management Options  Post-ictal state [facility])  Renal insufficiency  Seizure disorder [facility])  Diagnosis management comments: Patient significant other, Kamryn Gauthier [phone]), was in the room with him when I just went to reassess him as she tells me she has no psychiatric concerns.  I had initially gotten the be smart representative, Payton, to see him because his behavior is so bizarre and I wondered whether there was a psychiatric component.  After speaking with the significant other, it seems this is his typical postictal state.  It seems to be lasting longer than it should.  I gave him Keppra and sent off for a level before doing so.  He was hoping he would wake up and be ready to go home, but he has now been here for over 6 hours and is still quite somnolent.    [facility] Consult for Admission  11:21 PM    ED Room Number: ER19/19  Patient Name and age:  Samuel Rosario 24 y.o.  male  Working Diagnosis: Seizure disorder (HCC)  (primary encounter diagnosis)  Post-ictal state (HCC)  Renal insufficiency    COVID-19 Suspicion:  no  Sepsis present:  no  Reassessment needed: no  Code Status:  Full Code  Readmission: no  Isolation Requirements:  no  Recommended Level of Care:   med/surg  Department:[facility] Adult ED - [phone]  Other: Seizure disorder patient who came in postictal after 9 seizures and has still not come back to his baseline.  I gave him Keppra and consulted neurology.  His significant other, phone number in the chart, says he sometimes can have a prolonged postictal state.           Procedures

## 2020-05-01 NOTE — ED Notes (Signed)
Provider made aware of keppra and first bolus issue. Per provider, he will order more. Will continue to monitor.

## 2020-05-01 NOTE — ED Notes (Signed)
Patient refusing to keep cardiac, BP or pulse oximetry on. Wife at bedside. Will continue to monitor.

## 2020-05-01 NOTE — ED Notes (Signed)
Patient's wife Hulan Fess updated regarding patient. Her phone number for updates and questions is [phone].

## 2020-05-01 NOTE — ED Notes (Signed)
Patient wife at bedside. Urine sent. Will continue to monitor.

## 2020-05-02 LAB — DRUG SCREEN, URINE
AMPHETAMINES: NEGATIVE
Amphetamine Screen, Urine: NEGATIVE
BARBITURATES: NEGATIVE
BENZODIAZEPINES: NEGATIVE
Barbiturate Screen, Urine: NEGATIVE
Benzodiazepine Screen, Urine: NEGATIVE
COCAINE: NEGATIVE
Cocaine Screen Urine: NEGATIVE
METHADONE: NEGATIVE
Methadone Screen, Urine: NEGATIVE
OPIATES: NEGATIVE
Opiate Screen, Urine: NEGATIVE
PCP Screen, Urine: NEGATIVE
PCP(PHENCYCLIDINE): NEGATIVE
THC (TH-CANNABINOL): POSITIVE — AB
THC Screen, Urine: POSITIVE — AB

## 2020-05-02 LAB — EKG 12-LEAD
Atrial Rate: 78 {beats}/min
P Axis: 71 degrees
P-R Interval: 224 ms
Q-T Interval: 360 ms
QRS Duration: 88 ms
QTc Calculation (Bazett): 410 ms
R Axis: -51 degrees
T Axis: 45 degrees
Ventricular Rate: 78 {beats}/min

## 2020-05-02 LAB — EKG, 12 LEAD, INITIAL
Atrial Rate: 78 {beats}/min
Calculated P Axis: 71 degrees
Calculated R Axis: -51 degrees
Calculated T Axis: 45 degrees
P-R Interval: 224 ms
Q-T Interval: 360 ms
QRS Duration: 88 ms
QTC Calculation (Bezet): 410 ms
Ventricular Rate: 78 {beats}/min

## 2020-05-02 LAB — SAMPLES BEING HELD

## 2020-05-02 MED ORDER — SODIUM CHLORIDE 0.9 % IJ SYRG
Freq: Three times a day (TID) | INTRAMUSCULAR | Status: DC
Start: 2020-05-02 — End: 2020-05-02
  Administered 2020-05-02: 13:00:00 via INTRAVENOUS

## 2020-05-02 MED ORDER — SODIUM CHLORIDE 0.9 % IV
500 mg/5 mL | Freq: Two times a day (BID) | INTRAVENOUS | Status: DC
Start: 2020-05-02 — End: 2020-05-02
  Administered 2020-05-02: 13:00:00 via INTRAVENOUS

## 2020-05-02 MED ORDER — SODIUM CHLORIDE 0.9 % IV
500 mg/5 mL | INTRAVENOUS | Status: AC
Start: 2020-05-02 — End: 2020-05-01
  Administered 2020-05-02: 01:00:00 via INTRAVENOUS

## 2020-05-02 MED ORDER — SODIUM CHLORIDE 0.9 % IJ SYRG
INTRAMUSCULAR | Status: DC | PRN
Start: 2020-05-02 — End: 2020-05-02

## 2020-05-02 MED ORDER — LEVETIRACETAM 500 MG TAB
500 mg | Freq: Two times a day (BID) | ORAL | Status: DC
Start: 2020-05-02 — End: 2020-05-02

## 2020-05-02 MED ORDER — SODIUM CHLORIDE 0.9% BOLUS IV
0.9 % | INTRAVENOUS | Status: AC
Start: 2020-05-02 — End: 2020-05-02
  Administered 2020-05-02: 01:00:00 via INTRAVENOUS

## 2020-05-02 MED FILL — LEVETIRACETAM 500 MG/5 ML IV SOLN: 500 mg/5 mL | INTRAVENOUS | Qty: 5

## 2020-05-02 MED FILL — MONOJECT PREFILL ADVANCED 0.9 % SODIUM CHLORIDE INJECTION SYRINGE: INTRAMUSCULAR | Qty: 40

## 2020-05-02 MED FILL — SODIUM CHLORIDE 0.9 % IV: INTRAVENOUS | Qty: 1000

## 2020-05-02 MED FILL — LEVETIRACETAM 500 MG/5 ML IV SOLN: 500 mg/5 mL | INTRAVENOUS | Qty: 10

## 2020-05-02 NOTE — ED Notes (Signed)
Verbal shift change report given to Susie Fike, RN (oncoming nurse) by Natasha, RN (offgoing nurse). Report included the following information SBAR, ED Summary, MAR and Recent Results.

## 2020-05-02 NOTE — ED Notes (Signed)
Patient wife Davon updated on patient's condition. Notified that patient now alert and oriented. Patient is responding to all orientation questions and answering questions correctly at this time. Notified that we are still awaiting bed placement for him and patient willing to be evaluated by neurology. Verbally acknowledged understanding.

## 2020-05-02 NOTE — ED Notes (Signed)
The documentation for this period is being entered following the guidelines as defined in the [facility] downtime policy by Megan N Dover, RN.

## 2020-05-02 NOTE — H&P (Signed)
[facility] Adult  H[facility]ist [facility]  History and Physical    Primary Care Provider: None  Date of Service:  05/02/2020    Subjective:     Samuel Rosario is a 24 y.o. male with hx seizures, PTSD who presents with seizures.  Per chart review, patient reported noncompliance with keppra.  He and his wife are traveling through [place] from [place].  Patient was lethargic/post-ictal during my exam.    In the ED, VSS. Labs show leukocytosis to 13.1, and microcytosis with MCV 74. ETOH <10, and UDS + for Thc.    In the ED, he received keppra load, ativan, benadryl, compazine, and  Toradol. Neurology was consulted in the ED.    Review of Systems:    Review of systems not obtained due to patient factors.     Past Medical History:   Diagnosis Date   ??? PTSD (post-traumatic stress disorder)    ??? Seizures (HCC)       Past Surgical History:   Procedure Laterality Date   ??? HX OTHER SURGICAL      mass removal from chest      Prior to Admission medications    Not on File     No Known Allergies   History reviewed. No pertinent family history.     SOCIAL HISTORY:  Patient resides at Home.  Patient ambulates independently  Smoking history: unk  Alcohol history: unk    Objective:       Physical Exam:   Visit Vitals  BP (!) 125/58   Pulse 74   Temp 98 ??F (36.7 ??C)   Resp 22   SpO2 97%     General:  Sleepy, lethargic, cooperative, no distress, appears stated age.   Head:  Normocephalic, without obvious abnormality, atraumatic.           Nose: Nares normal. Septum midline.    Throat: Lips, mucosa, and tongue normal.    Neck: Supple, symmetrical, trachea midline   Back:   Symmetric, no curvature. ROM normal.    Lungs:   Clear to auscultation bilaterally.   Chest wall:  No tenderness or deformity.   Heart:  Regular rate and rhythm, S1, S2 normal, harsh holosystolic murmur , click, rub or gallop.   Abdomen:   Soft, non-tender. Bowel sounds normal. No masses,  No organomegaly.           Extremities: Extremities normal, atraumatic, no  cyanosis or edema.   Pulses: 2+ radial pulses   Skin: Skin color, texture, turgor normal. No rashes or lesions         ECG:  SR AVB    Data Review:   All diagnostic labs and studies have been reviewed.    Assessment:     Active Problems:    * No active hospital problems. *      Plan:     #Seizure d/o  -2/2 likely non-compliance  -non-con CT head negative  -continue IV keppra  -traveling through [place] from [place] so will need to ensure he has AED at [facility]  -seizure precautions    #Systolic murmur  -harsh systolic murmur with abnormal EKG  -?new, needs OP f/u    FEN: regular diet if alert to take PO  dvt ppx: none, ambulate ad lib  MPOA: [name] (spouse) [phone]  Code: Full    FUNCTIONAL STATUS PRIOR TO HOSPITALIZATION Ambulates Independently     Signed By: Fabian November, MD     May 02, 2020

## 2020-05-02 NOTE — Discharge Summary (Addendum)
Inpatient hospitalist discharge summary                Brief Overview    PATIENT ID: Samuel Rosario    MRN: [id number]     DATE OF BIRTH: 03-02-1996    Admitting Provider: Lauraine Rinne, MD    Discharging Provider: Sammuel Hines, MD   To contact this individual call [phone] and ask the operator to page.  If unavailable ask to be transferred the Adult Hospitalist Department.      PCP at discharge: None None   None ([phone]) Patient stated that they have no PCP    Admission date: 05/01/2020  Date of Discharge: 05/02/20    Chief complaint:   Chief Complaint   Patient presents with   ??? Seizure     Patient Active Problem List   Diagnosis Code   ??? Frequent seizures ([place]) R56.9         Discharge diagnosis, hospital course/plan:  Patient admitted with seizures. Received a call that patient wants to leave AMA.  I discussed with him regarding risks of leaving AMA including death and worsening infection. Patient is adamant that that he does not want to stay. As per nursing staff patient wants to leave after finishing his medication. Patient left AMA after signing the paperwork.         Patient was advised to seek medical help/ care or return to ED, if symptoms recur, worsen or new symptoms develop.      Discharge Disposition:  Left Against Medical Advice        Code status at discharge:  Full Code           Future appointments-  No future appointments.  Follow-up Information    None         Operative procedures performed:      Consults:  IP CONSULT TO NEUROLOGY    Procedures:   * No surgery found *    Diet:  None    Pertinent test results:  CT HEAD WO CONT    Result Date: 05/01/2020  INDICATION: bizarre behavior, h/o sz w/ multiple today. c/o headache and photophobia. a/o w/ no focal deficit EXAM: CT HEAD without contrast. TECHNIQUE: Unenhanced CT Head is performed. CT dose reduction was achieved through use of a standardized protocol tailored for this examination and automatic exposure control for dose modulation.  FINDINGS: There is patient motion. There is no mass, hemorrhage, hydrocephalus, midline shift or extra-axial fluid collection. Bone windows are unremarkable.     No apparent acute intracranial finding, allowing for patient motion.        Recent Results (from the past 168 hour(s))   EKG, 12 LEAD, INITIAL    Collection Time: 05/01/20  5:44 PM   Result Value Ref Range    Ventricular Rate 78 BPM    Atrial Rate 78 BPM    P-R Interval 224 ms    QRS Duration 88 ms    Q-T Interval 360 ms    QTC Calculation (Bezet) 410 ms    Calculated P Axis 71 degrees    Calculated R Axis -51 degrees    Calculated T Axis 45 degrees    Diagnosis       Sinus rhythm with 1st degree AV block  Left anterior fascicular block  No previous ECGs available  Confirmed by Carrie Mew, MD ([id number]) on 05/01/2020 8:59:04 PM     CBC WITH AUTOMATED DIFF    Collection Time: 05/01/20  5:49 PM  Result Value Ref Range    WBC 13.1 (H) 4.1 - 11.1 K/uL    RBC 5.93 (H) 4.10 - 5.70 M/uL    HGB 13.6 12.1 - 17.0 g/dL    HCT 43.9 36.6 - 50.3 %    MCV 74.0 (L) 80.0 - 99.0 FL    MCH 22.9 (L) 26.0 - 34.0 PG    MCHC 31.0 30.0 - 36.5 g/dL    RDW 14.7 (H) 11.5 - 14.5 %    PLATELET 282 150 - 400 K/uL    MPV 10.7 8.9 - 12.9 FL    NRBC 0.0 0 PER 100 WBC    ABSOLUTE NRBC 0.00 0.00 - 0.01 K/uL    NEUTROPHILS 89 (H) 32 - 75 %    LYMPHOCYTES 5 (L) 12 - 49 %    MONOCYTES 5 5 - 13 %    EOSINOPHILS 0 0 - 7 %    BASOPHILS 0 0 - 1 %    IMMATURE GRANULOCYTES 1 (H) 0.0 - 0.5 %    ABS. NEUTROPHILS 11.6 (H) 1.8 - 8.0 K/UL    ABS. LYMPHOCYTES 0.7 (L) 0.8 - 3.5 K/UL    ABS. MONOCYTES 0.7 0.0 - 1.0 K/UL    ABS. EOSINOPHILS 0.0 0.0 - 0.4 K/UL    ABS. BASOPHILS 0.0 0.0 - 0.1 K/UL    ABS. IMM. GRANS. 0.1 (H) 0.00 - 0.04 K/UL    DF SMEAR SCANNED      RBC COMMENTS MICROCYTOSIS  1+        RBC COMMENTS HYPOCHROMIA  1+       METABOLIC PANEL, COMPREHENSIVE    Collection Time: 05/01/20  5:49 PM   Result Value Ref Range    Sodium 137 136 - 145 mmol/L    Potassium 3.5 3.5 - 5.1 mmol/L    Chloride 106 97  - 108 mmol/L    CO2 21 21 - 32 mmol/L    Anion gap 10 5 - 15 mmol/L    Glucose 87 65 - 100 mg/dL    BUN 7 6 - 20 MG/DL    Creatinine 1.33 (H) 0.70 - 1.30 MG/DL    BUN/Creatinine ratio 5 (L) 12 - 20      GFR est AA >60 >60 ml/min/1.67m    GFR est non-AA >60 >60 ml/min/1.746m   Calcium 9.1 8.5 - 10.1 MG/DL    Bilirubin, total 0.5 0.2 - 1.0 MG/DL    ALT (SGPT) 24 12 - 78 U/L    AST (SGOT) 32 15 - 37 U/L    Alk. phosphatase 74 45 - 117 U/L    Protein, total 8.2 6.4 - 8.2 g/dL    Albumin 4.4 3.5 - 5.0 g/dL    Globulin 3.8 2.0 - 4.0 g/dL    A-G Ratio 1.2 1.1 - 2.2     SAMPLES BEING HELD    Collection Time: 05/01/20  5:49 PM   Result Value Ref Range    SAMPLES BEING HELD red,blu     COMMENT        Add-on orders for these samples will be processed based on acceptable specimen integrity and analyte stability, which may vary by analyte.   ETHYL ALCOHOL    Collection Time: 05/01/20  5:49 PM   Result Value Ref Range    ALCOHOL(ETHYL),SERUM <10 <10 MG/DL   DRUG SCREEN, URINE    Collection Time: 05/01/20 10:42 PM   Result Value Ref Range    AMPHETAMINES Negative NEG  BARBITURATES Negative NEG      BENZODIAZEPINES Negative NEG      COCAINE Negative NEG      METHADONE Negative NEG      OPIATES Negative NEG      PCP(PHENCYCLIDINE) Negative NEG      THC (TH-CANNABINOL) Positive (A) NEG      Drug screen comment (NOTE)    SAMPLES BEING HELD    Collection Time: 05/01/20 10:42 PM   Result Value Ref Range    SAMPLES BEING HELD 1CUP URINE     COMMENT        Add-on orders for these samples will be processed based on acceptable specimen integrity and analyte stability, which may vary by analyte.           Physical Exam on Discharge:      Vital signs:   Patient Vitals for the past 12 hrs:   Temp Pulse BP SpO2   05/02/20 0948 98.4 ??F (36.9 ??C) ??? 104/60 100 %   05/02/20 0840 98.5 ??F (36.9 ??C) 70 (!) 92/52 99 %   05/02/20 0500 ??? ??? (!) 107/58 100 %   05/02/20 0400 ??? ??? 116/61 100 %   05/02/20 0300 ??? ??? 115/61 100 %   05/02/20 0200 ??? ??? (!)  116/56 99 %   05/02/20 0030 ??? ??? 110/65 98 %   05/02/20 0019 ??? ??? (!) 144/75 ???       Visit Vitals  BP 104/60   Pulse 70   Temp 98.4 ??F (36.9 ??C)   Resp 22   SpO2 100%     General:  Alert, cooperative, no distress, appears stated age.   Head:  Normocephalic, without obvious abnormality, atraumatic.   Lungs:   Clear to auscultation bilaterally.   Chest wall:  No tenderness or deformity.   Heart:  Regular rate and rhythm, S1, S2 normal, no murmur, click, rub or gallop.   Abdomen:   Soft, non-tender. Bowel sounds normal. No masses,  No organomegaly.                   There are no discharge medications for this patient.        Total time spent on discharge planning, counseling and co-ordination of care:   35 minutes    Sammuel Hines, MD  05/02/20  10:43 AM

## 2020-05-02 NOTE — ED Notes (Signed)
Patient leaving AMA. Dr. Javaid discussed risks/benefits. AMA form signed. Patient alert, oriented, self ambulatory upon leaving ED.

## 2020-05-02 NOTE — H&P (Signed)
H&P by Lauraine Rinne,  MD at 05/02/20 0028                Author: Lauraine Rinne, MD  Service: FAMILY MEDICINE  Author Type: Physician       Filed: 05/02/20 0840  Date of Service: 05/02/20 0028  Status: Signed          Editor: Lauraine Rinne, MD (Physician)                    [facility] Adult  Hospitalist [facility]   History and Physical      Primary Care Provider: None   Date of Service:  05/02/2020        Subjective:        Samuel Rosario is a 24 y.o.  male with hx seizures, PTSD who presents with seizures.  Per chart review, patient reported noncompliance with keppra.  He and his wife are traveling through [place] from [place].  Patient was lethargic/post-ictal  during my exam.      In the ED, VSS. Labs show leukocytosis to 13.1, and microcytosis with MCV 74. ETOH <10, and UDS + for Thc.      In the ED, he received keppra load, ativan, benadryl, compazine, and  Toradol. Neurology was consulted in the ED.      Review of Systems:      Review of systems not obtained due to patient factors.         Past Medical History:        Diagnosis  Date         ?  PTSD (post-traumatic stress disorder)           ?  Seizures ([place])             Past Surgical History:         Procedure  Laterality  Date          ?  HX OTHER SURGICAL              mass removal from chest           Prior to Admission medications        Not on File        No Known Allergies    History reviewed. No pertinent family history.       SOCIAL HISTORY:   Patient resides at Home.   Patient ambulates independently   Smoking history: unk   Alcohol history: unk        Objective:           Physical Exam:    Visit Vitals      BP  (!) 125/58     Pulse  74     Temp  98 ??F (36.7 ??C)     Resp  22        SpO2  97%           General:   Sleepy, lethargic, cooperative, no distress, appears stated age.        Head:   Normocephalic, without obvious abnormality, atraumatic.                         Nose:  Nares normal. Septum midline.         Throat:  Lips, mucosa,  and tongue normal.         Neck:  Supple, symmetrical, trachea midline     Back:    Symmetric,  no curvature. ROM normal.      Lungs:    Clear to auscultation bilaterally.        Chest wall:   No tenderness or deformity.        Heart:   Regular rate and rhythm, S1, S2 normal, harsh holosystolic murmur , click, rub or gallop.     Abdomen:    Soft, non-tender. Bowel sounds normal. No masses,  No organomegaly.                         Extremities:  Extremities normal, atraumatic, no cyanosis or edema.     Pulses:  2+ radial pulses     Skin:  Skin color, texture, turgor normal. No rashes or lesions               ECG:  SR AVB      Data Review:    All diagnostic labs and studies have been reviewed.        Assessment:        Active Problems:     * No active hospital problems. *           Plan:        #Seizure d/o   -2/2 likely non-compliance   -non-con CT head negative   -continue IV keppra   -traveling through [place] from [place] so will need to ensure he has AED at [facility]   -seizure precautions      #Systolic murmur   -harsh systolic murmur with abnormal EKG   -?new, needs OP f/u      FEN: regular diet if alert to take PO   dvt ppx: none, ambulate ad lib   MPOA: Samuel Rosario (spouse) [phone]   Code: Full      FUNCTIONAL STATUS PRIOR TO HOSPITALIZATION Ambulates Independently           Signed By:  Fabian November, MD           May 02, 2020

## 2020-05-02 NOTE — ED Notes (Signed)
Patient wife Samuel Rosario updated on patient's condition. Notified that patient now alert and oriented. Patient is responding to all orientation questions and answering questions correctly at this time. Notified that we are still awaiting bed placement for him and patient willing to be evaluated by neurology. Verbally acknowledged understanding.

## 2020-05-02 NOTE — ED Notes (Signed)
Verbal shift change report given to Daymon Larsen, RN (Cabin crew) by Marcelle Smiling, RN (offgoing nurse). Report included the following information SBAR, ED Summary, MAR and Recent Results.

## 2020-05-02 NOTE — ED Notes (Signed)
Patient leaving AMA. Dr. Jerre Simon discussed risks/benefits. AMA form signed. Patient alert, oriented, self ambulatory upon leaving ED.

## 2020-05-02 NOTE — Discharge Summary (Signed)
Discharge Summary by Sammuel Hines, MD at 05/02/20 [phone]                Author: Sammuel Hines, MD  Service: Hospitalist  Author Type: Physician       Filed: 05/10/20 0750  Date of Service: 05/02/20 0953  Status: Addendum          Editor: Sammuel Hines, MD (Physician)          Related Notes: Original Note by Sammuel Hines, MD (Physician) filed at 05/02/20 1050                                Inpatient hospitalist discharge summary                  Brief Overview      PATIENT ID: Samuel Rosario      MRN: [id number]       DATE OF BIRTH: 10-03-1996      Admitting Provider: Lauraine Rinne, MD      Discharging Provider: Sammuel Hines, MD    To contact this individual call [phone] and ask the operator to page.  If unavailable ask to be transferred the Adult Hospitalist Department.         PCP at discharge: None None    None ([phone]) Patient stated that they have no PCP      Admission date: 05/01/2020  Date of Discharge: 05/02/20      Chief complaint:      Chief Complaint       Patient presents with        ?  Seizure          Patient Active Problem List        Diagnosis  Code         ?  Frequent seizures ([place])  R56.9              Discharge diagnosis, hospital course/plan:   Patient admitted with seizures. Received a call that patient wants to leave AMA.  I discussed with him regarding risks of leaving AMA including death and worsening infection. Patient is adamant that that he does not want to stay. As per  nursing staff patient wants to leave after finishing his medication. Patient left AMA after signing the paperwork.             Patient was advised to seek medical help/ care or return to ED, if symptoms recur, worsen or new symptoms develop.         Discharge Disposition:   Left Against Medical Advice            Code status at discharge:   Full Code                Future appointments-   No future appointments.     Follow-up Information      None                Operative procedures performed:         Consults:  IP CONSULT TO  NEUROLOGY      Procedures:    * No surgery found *      Diet:  None      Pertinent test results:   CT HEAD WO CONT      Result Date: 05/01/2020   INDICATION: bizarre behavior, h/o sz w/ multiple today. c/o headache and photophobia. a/o w/ no focal deficit EXAM:  CT HEAD without contrast. TECHNIQUE: Unenhanced CT Head is performed. CT dose reduction was achieved through use of a standardized protocol  tailored for this examination and automatic exposure control for dose modulation. FINDINGS: There is patient motion. There is no mass, hemorrhage, hydrocephalus, midline shift or extra-axial fluid collection. Bone windows are unremarkable.       No apparent acute intracranial finding, allowing for patient motion.             Recent Results (from the past 168 hour(s))     EKG, 12 LEAD, INITIAL          Collection Time: 05/01/20  5:44 PM         Result  Value  Ref Range            Ventricular Rate  78  BPM       Atrial Rate  78  BPM       P-R Interval  224  ms       QRS Duration  88  ms       Q-T Interval  360  ms       QTC Calculation (Bezet)  410  ms       Calculated P Axis  71  degrees       Calculated R Axis  -51  degrees       Calculated T Axis  45  degrees       Diagnosis                 Sinus rhythm with 1st degree AV block   Left anterior fascicular block   No previous ECGs available   Confirmed by Carrie Mew, MD ([id number]) on 05/01/2020 8:59:04 PM          CBC WITH AUTOMATED DIFF          Collection Time: 05/01/20  5:49 PM         Result  Value  Ref Range            WBC  13.1 (H)  4.1 - 11.1 K/uL       RBC  5.93 (H)  4.10 - 5.70 M/uL       HGB  13.6  12.1 - 17.0 g/dL       HCT  43.9  36.6 - 50.3 %       MCV  74.0 (L)  80.0 - 99.0 FL       MCH  22.9 (L)  26.0 - 34.0 PG       MCHC  31.0  30.0 - 36.5 g/dL       RDW  14.7 (H)  11.5 - 14.5 %       PLATELET  282  150 - 400 K/uL       MPV  10.7  8.9 - 12.9 FL       NRBC  0.0  0 PER 100 WBC       ABSOLUTE NRBC  0.00  0.00 - 0.01 K/uL       NEUTROPHILS  89 (H)  32 - 75 %        LYMPHOCYTES  5 (L)  12 - 49 %       MONOCYTES  5  5 - 13 %       EOSINOPHILS  0  0 - 7 %       BASOPHILS  0  0 - 1 %       IMMATURE GRANULOCYTES  1 (  H)  0.0 - 0.5 %       ABS. NEUTROPHILS  11.6 (H)  1.8 - 8.0 K/UL       ABS. LYMPHOCYTES  0.7 (L)  0.8 - 3.5 K/UL       ABS. MONOCYTES  0.7  0.0 - 1.0 K/UL       ABS. EOSINOPHILS  0.0  0.0 - 0.4 K/UL       ABS. BASOPHILS  0.0  0.0 - 0.1 K/UL       ABS. IMM. GRANS.  0.1 (H)  0.00 - 0.04 K/UL       DF  SMEAR SCANNED          RBC COMMENTS  MICROCYTOSIS   1+             RBC COMMENTS  HYPOCHROMIA   1+             METABOLIC PANEL, COMPREHENSIVE          Collection Time: 05/01/20  5:49 PM         Result  Value  Ref Range            Sodium  137  136 - 145 mmol/L       Potassium  3.5  3.5 - 5.1 mmol/L       Chloride  106  97 - 108 mmol/L       CO2  21  21 - 32 mmol/L       Anion gap  10  5 - 15 mmol/L       Glucose  87  65 - 100 mg/dL       BUN  7  6 - 20 MG/DL       Creatinine  1.33 (H)  0.70 - 1.30 MG/DL       BUN/Creatinine ratio  5 (L)  12 - 20         GFR est AA  >60  >60 ml/min/1.73m       GFR est non-AA  >60  >60 ml/min/1.768m      Calcium  9.1  8.5 - 10.1 MG/DL       Bilirubin, total  0.5  0.2 - 1.0 MG/DL       ALT (SGPT)  24  12 - 78 U/L       AST (SGOT)  32  15 - 37 U/L       Alk. phosphatase  74  45 - 117 U/L       Protein, total  8.2  6.4 - 8.2 g/dL       Albumin  4.4  3.5 - 5.0 g/dL       Globulin  3.8  2.0 - 4.0 g/dL       A-G Ratio  1.2  1.1 - 2.2         SAMPLES BEING HELD          Collection Time: 05/01/20  5:49 PM         Result  Value  Ref Range            SAMPLES BEING HELD  red,blu         COMMENT                  Add-on orders for these samples will be processed based on acceptable specimen integrity and analyte stability, which may vary by analyte.       ETHYL ALCOHOL  Collection Time: 05/01/20  5:49 PM         Result  Value  Ref Range            ALCOHOL(ETHYL),SERUM  <10  <10 MG/DL       DRUG SCREEN, URINE          Collection Time: 05/01/20  10:42 PM         Result  Value  Ref Range            AMPHETAMINES  Negative  NEG         BARBITURATES  Negative  NEG         BENZODIAZEPINES  Negative  NEG         COCAINE  Negative  NEG         METHADONE  Negative  NEG         OPIATES  Negative  NEG         PCP(PHENCYCLIDINE)  Negative  NEG         THC (TH-CANNABINOL)  Positive (A)  NEG         Drug screen comment  (NOTE)         SAMPLES BEING HELD          Collection Time: 05/01/20 10:42 PM         Result  Value  Ref Range            SAMPLES BEING HELD  1CUP URINE         COMMENT                  Add-on orders for these samples will be processed based on acceptable specimen integrity and analyte stability, which may vary by analyte.                 Physical Exam on Discharge:         Vital signs:    Patient Vitals for the past 12 hrs:           Temp  Pulse  BP  SpO2           05/02/20 0948  98.4 ??F (36.9 ??C)  --  104/60  100 %           05/02/20 0840  98.5 ??F (36.9 ??C)  70  (!) 92/52  99 %     05/02/20 0500  --  --  (!) 107/58  100 %     05/02/20 0400  --  --  116/61  100 %     05/02/20 0300  --  --  115/61  100 %     05/02/20 0200  --  --  (!) 116/56  99 %     05/02/20 0030  --  --  110/65  98 %           05/02/20 0019  --  --  (!) 144/75  --           Visit Vitals      BP  104/60     Pulse  70     Temp  98.4 ??F (36.9 ??C)     Resp  22        SpO2  100%           General:   Alert, cooperative, no distress, appears stated age.     Head:   Normocephalic, without obvious abnormality, atraumatic.     Lungs:    Clear to  auscultation bilaterally.        Chest wall:   No tenderness or deformity.        Heart:   Regular rate and rhythm, S1, S2 normal, no murmur, click, rub or gallop.        Abdomen:    Soft, non-tender. Bowel sounds normal. No masses,  No organomegaly.                                   There are no discharge medications for this patient.            Total time spent on discharge planning, counseling and co-ordination of care:    35 minutes      Sammuel Hines, MD   05/02/20   10:43 AM

## 2020-05-04 LAB — LEVETIRACETAM (KEPPRA)
KEPPRA,KEPP: 8.4 ug/mL — ABNORMAL LOW (ref 10.0–40.0)
Levetiracetam (Keppra): 8.4 ug/mL — ABNORMAL LOW (ref 10.0–40.0)

## 2020-08-01 ENCOUNTER — Inpatient Hospital Stay: Admit: 2020-08-01

## 2020-08-01 ENCOUNTER — Inpatient Hospital Stay
Admit: 2020-08-01 | Discharge: 2020-08-03 | Discharge status: Against Medical Advice | Attending: Internal Medicine | Admitting: Internal Medicine

## 2020-08-01 ENCOUNTER — Emergency Department: Admit: 2020-08-01

## 2020-08-01 DIAGNOSIS — G40901 Epilepsy, unspecified, not intractable, with status epilepticus: Principal | ICD-10-CM

## 2020-08-01 LAB — URINALYSIS WITH MICROSCOPIC
BACTERIA, URINE: NEGATIVE /hpf
Bilirubin, Urine: NEGATIVE
Glucose, Ur: NEGATIVE mg/dL
Ketones, Urine: 80 mg/dL — AB
Leukocyte Esterase, Urine: NEGATIVE
Nitrite, Urine: NEGATIVE
Protein, UA: NEGATIVE mg/dL
Specific Gravity, UA: 1.022 (ref 1.003–1.030)
Urobilinogen, UA, POCT: 0.2 EU/dL (ref 0.2–1.0)
pH, UA: 5 (ref 5.0–8.0)

## 2020-08-01 LAB — LACTIC ACID
Lactic Acid: 2.4 MMOL/L (ref 0.4–2.0)
Lactic Acid: 2.2 MMOL/L (ref 0.4–2.0)
Lactic Acid: 1 MMOL/L (ref 0.4–2.0)
Lactic acid: 2.4 MMOL/L — CR (ref 0.4–2.0)
Lactic acid: 2.2 MMOL/L — CR (ref 0.4–2.0)
Lactic acid: 1 MMOL/L (ref 0.4–2.0)

## 2020-08-01 LAB — BASIC METABOLIC PANEL
Anion Gap: 5 mmol/L (ref 5–15)
BUN: 12 MG/DL (ref 6–20)
Bun/Cre Ratio: 9 — ABNORMAL LOW (ref 12–20)
CO2: 21 mmol/L (ref 21–32)
Calcium: 8.8 MG/DL (ref 8.5–10.1)
Chloride: 106 mmol/L (ref 97–108)
Creatinine: 1.38 MG/DL — ABNORMAL HIGH (ref 0.70–1.30)
EGFR IF NonAfrican American: >60 mL/min/{1.73_m2} (ref >60)
GFR African American: >60 mL/min/{1.73_m2} (ref >60)
Glucose: 87 mg/dL (ref 65–100)
Sodium: 132 mmol/L — ABNORMAL LOW (ref 136–145)

## 2020-08-01 LAB — COMPREHENSIVE METABOLIC PANEL
ALT: 52 U/L (ref 12–78)
AST: 146 U/L — ABNORMAL HIGH (ref 15–37)
Albumin/Globulin Ratio: 1.1 (ref 1.1–2.2)
Albumin: 4.4 g/dL (ref 3.5–5.0)
Alkaline Phosphatase: 71 U/L (ref 45–117)
Anion Gap: 10 mmol/L (ref 5–15)
BUN: 13 MG/DL (ref 6–20)
Bun/Cre Ratio: 8 — ABNORMAL LOW (ref 12–20)
CO2: 20 mmol/L — ABNORMAL LOW (ref 21–32)
Calcium: 8.8 MG/DL (ref 8.5–10.1)
Chloride: 107 mmol/L (ref 97–108)
Creatinine: 1.62 MG/DL — ABNORMAL HIGH (ref 0.70–1.30)
EGFR IF NonAfrican American: 52 mL/min/{1.73_m2} — ABNORMAL LOW (ref >60)
GFR African American: >60 mL/min/{1.73_m2} (ref >60)
Globulin: 4 g/dL (ref 2.0–4.0)
Glucose: 79 mg/dL (ref 65–100)
Potassium: 4.3 mmol/L (ref 3.5–5.1)
Sodium: 137 mmol/L (ref 136–145)
Total Bilirubin: 0.5 MG/DL (ref 0.2–1.0)
Total Protein: 8.4 g/dL — ABNORMAL HIGH (ref 6.4–8.2)

## 2020-08-01 LAB — DRUG SCREEN, URINE
AMPHETAMINES: NEGATIVE
Amphetamine Screen, Urine: NEGATIVE
BARBITURATES: NEGATIVE
BENZODIAZEPINES: POSITIVE — AB
Barbiturate Screen, Urine: NEGATIVE
Benzodiazepine Screen, Urine: POSITIVE — AB
COCAINE: NEGATIVE
Cocaine Screen Urine: NEGATIVE
METHADONE: NEGATIVE
Methadone Screen, Urine: NEGATIVE
OPIATES: NEGATIVE
Opiate Screen, Urine: NEGATIVE
PCP Screen, Urine: NEGATIVE
PCP(PHENCYCLIDINE): NEGATIVE
THC (TH-CANNABINOL): POSITIVE — AB
THC Screen, Urine: POSITIVE — AB

## 2020-08-01 LAB — PROCALCITONIN
Procalcitonin: 0.48 ng/mL
Procalcitonin: 0.48 ng/mL
Procalcitonin: 0.54 ng/mL
Procalcitonin: 0.54 ng/mL

## 2020-08-01 LAB — CBC
Hematocrit: 40.7 % (ref 36.6–50.3)
Hemoglobin: 12.9 g/dL (ref 12.1–17.0)
MCH: 23.6 PG — ABNORMAL LOW (ref 26.0–34.0)
MCHC: 31.7 g/dL (ref 30.0–36.5)
MCV: 74.4 FL — ABNORMAL LOW (ref 80.0–99.0)
MPV: 10.7 FL (ref 8.9–12.9)
NRBC Absolute: 0 10*3/uL (ref 0.00–0.01)
Nucleated RBCs: 0 PER 100 WBC
Platelets: 234 10*3/uL (ref 150–400)
RBC: 5.47 M/uL (ref 4.10–5.70)
RDW: 14.9 % — ABNORMAL HIGH (ref 11.5–14.5)
WBC: 12.4 10*3/uL — ABNORMAL HIGH (ref 4.1–11.1)

## 2020-08-01 LAB — MAGNESIUM
Magnesium: 2.1 mg/dL (ref 1.6–2.4)
Magnesium: 2.1 mg/dL (ref 1.6–2.4)
Magnesium: 2.2 mg/dL (ref 1.6–2.4)
Magnesium: 2.2 mg/dL (ref 1.6–2.4)

## 2020-08-01 LAB — PROTIME-INR
INR: 1.2 — ABNORMAL HIGH (ref 0.9–1.1)
Protime: 12.4 s — ABNORMAL HIGH (ref 9.0–11.1)

## 2020-08-01 LAB — CBC WITH AUTO DIFFERENTIAL
Basophils %: 0 % (ref 0–1)
Basophils Absolute: 0 10*3/uL (ref 0.0–0.1)
Eosinophils %: 0 % (ref 0–7)
Eosinophils Absolute: 0 10*3/uL (ref 0.0–0.4)
Granulocyte Absolute Count: 0.1 10*3/uL — ABNORMAL HIGH (ref 0.00–0.04)
Hematocrit: 41.7 % (ref 36.6–50.3)
Hemoglobin: 12.8 g/dL (ref 12.1–17.0)
Immature Granulocytes: 1 % — ABNORMAL HIGH (ref 0.0–0.5)
Lymphocytes %: 7 % — ABNORMAL LOW (ref 12–49)
Lymphocytes Absolute: 0.9 10*3/uL (ref 0.8–3.5)
MCH: 22.8 PG — ABNORMAL LOW (ref 26.0–34.0)
MCHC: 30.7 g/dL (ref 30.0–36.5)
MCV: 74.2 FL — ABNORMAL LOW (ref 80.0–99.0)
MPV: 10.4 FL (ref 8.9–12.9)
Monocytes %: 4 % — ABNORMAL LOW (ref 5–13)
Monocytes Absolute: 0.5 10*3/uL (ref 0.0–1.0)
NRBC Absolute: 0 10*3/uL (ref 0.00–0.01)
Neutrophils %: 88 % — ABNORMAL HIGH (ref 32–75)
Neutrophils Absolute: 11.6 10*3/uL — ABNORMAL HIGH (ref 1.8–8.0)
Nucleated RBCs: 0 PER 100 WBC
Platelets: 245 10*3/uL (ref 150–400)
RBC: 5.62 M/uL (ref 4.10–5.70)
RDW: 14.7 % — ABNORMAL HIGH (ref 11.5–14.5)
WBC: 13.1 10*3/uL — ABNORMAL HIGH (ref 4.1–11.1)

## 2020-08-01 LAB — TROPONIN
Troponin I: <0.05 ng/mL (ref <0.05)
Troponin I: <0.05 ng/mL (ref <0.05)
Troponin I: <0.05 ng/mL (ref <0.05)

## 2020-08-01 LAB — PHOSPHORUS
Phosphorus: 3 MG/DL (ref 2.6–4.7)
Phosphorus: 3 MG/DL (ref 2.6–4.7)
Phosphorus: 2.5 MG/DL — ABNORMAL LOW (ref 2.6–4.7)
Phosphorus: 2.5 MG/DL — ABNORMAL LOW (ref 2.6–4.7)

## 2020-08-01 LAB — D-DIMER, QUANTITATIVE: D-Dimer, Quant: 1.22 mg/L FEU — ABNORMAL HIGH (ref 0.00–0.65)

## 2020-08-01 LAB — PROBNP, N-TERMINAL
BNP: 103 PG/ML (ref <125)
BNP: 165 PG/ML — ABNORMAL HIGH (ref <125)

## 2020-08-01 LAB — C-REACTIVE PROTEIN: CRP: 2.77 mg/dL — ABNORMAL HIGH (ref 0.00–0.60)

## 2020-08-01 LAB — COVID-19, RAPID: SARS-CoV-2, Rapid: NOT DETECTED

## 2020-08-01 LAB — SEDIMENTATION RATE
Sed Rate: 2 mm/hr (ref 0–15)
Sed Rate: 1 mm/hr (ref 0–15)

## 2020-08-01 LAB — CK
CK: 5185 U/L — ABNORMAL HIGH (ref 39–308)
Total CK: 5185 U/L — ABNORMAL HIGH (ref 39–308)

## 2020-08-01 LAB — METABOLIC PANEL, BASIC
Anion gap: 5 mmol/L (ref 5–15)
BUN/Creatinine ratio: 9 — ABNORMAL LOW (ref 12–20)
BUN: 12 MG/DL (ref 6–20)
CO2: 21 mmol/L (ref 21–32)
Calcium: 8.8 MG/DL (ref 8.5–10.1)
Chloride: 106 mmol/L (ref 97–108)
Creatinine: 1.38 MG/DL — ABNORMAL HIGH (ref 0.70–1.30)
GFR est AA: >60 mL/min/{1.73_m2} (ref >60)
GFR est non-AA: >60 mL/min/{1.73_m2} (ref >60)
Glucose: 87 mg/dL (ref 65–100)
Sodium: 132 mmol/L — ABNORMAL LOW (ref 136–145)

## 2020-08-01 LAB — CBC WITH AUTOMATED DIFF
ABS. BASOPHILS: 0 10*3/uL (ref 0.0–0.1)
ABS. EOSINOPHILS: 0 10*3/uL (ref 0.0–0.4)
ABS. IMM. GRANS.: 0.1 10*3/uL — ABNORMAL HIGH (ref 0.00–0.04)
ABS. LYMPHOCYTES: 0.9 10*3/uL (ref 0.8–3.5)
ABS. MONOCYTES: 0.5 10*3/uL (ref 0.0–1.0)
ABS. NEUTROPHILS: 11.6 10*3/uL — ABNORMAL HIGH (ref 1.8–8.0)
ABSOLUTE NRBC: 0 10*3/uL (ref 0.00–0.01)
BASOPHILS: 0 % (ref 0–1)
EOSINOPHILS: 0 % (ref 0–7)
HCT: 41.7 % (ref 36.6–50.3)
HGB: 12.8 g/dL (ref 12.1–17.0)
IMMATURE GRANULOCYTES: 1 % — ABNORMAL HIGH (ref 0.0–0.5)
LYMPHOCYTES: 7 % — ABNORMAL LOW (ref 12–49)
MCH: 22.8 PG — ABNORMAL LOW (ref 26.0–34.0)
MCHC: 30.7 g/dL (ref 30.0–36.5)
MCV: 74.2 FL — ABNORMAL LOW (ref 80.0–99.0)
MONOCYTES: 4 % — ABNORMAL LOW (ref 5–13)
MPV: 10.4 FL (ref 8.9–12.9)
NEUTROPHILS: 88 % — ABNORMAL HIGH (ref 32–75)
NRBC: 0 PER 100 WBC
PLATELET: 245 10*3/uL (ref 150–400)
RBC: 5.62 M/uL (ref 4.10–5.70)
RDW: 14.7 % — ABNORMAL HIGH (ref 11.5–14.5)
WBC: 13.1 10*3/uL — ABNORMAL HIGH (ref 4.1–11.1)

## 2020-08-01 LAB — URINALYSIS W/MICROSCOPIC
Bacteria: NEGATIVE /hpf
Bilirubin: NEGATIVE
Glucose: NEGATIVE mg/dL
Ketone: 80 mg/dL — AB
Leukocyte Esterase: NEGATIVE
Nitrites: NEGATIVE
Protein: NEGATIVE mg/dL
Specific gravity: 1.022 (ref 1.003–1.030)
Urobilinogen: 0.2 EU/dL (ref 0.2–1.0)
pH (UA): 5 (ref 5.0–8.0)

## 2020-08-01 LAB — NT-PRO BNP
NT pro-BNP: 103 PG/ML (ref <125)
NT pro-BNP: 165 PG/ML — ABNORMAL HIGH (ref <125)

## 2020-08-01 LAB — SED RATE (ESR)
Sed rate, automated: 2 mm/hr (ref 0–15)
Sed rate, automated: 1 mm/hr (ref 0–15)

## 2020-08-01 LAB — METABOLIC PANEL, COMPREHENSIVE
A-G Ratio: 1.1 (ref 1.1–2.2)
ALT (SGPT): 52 U/L (ref 12–78)
AST (SGOT): 146 U/L — ABNORMAL HIGH (ref 15–37)
Albumin: 4.4 g/dL (ref 3.5–5.0)
Alk. phosphatase: 71 U/L (ref 45–117)
Anion gap: 10 mmol/L (ref 5–15)
BUN/Creatinine ratio: 8 — ABNORMAL LOW (ref 12–20)
BUN: 13 MG/DL (ref 6–20)
Bilirubin, total: 0.5 MG/DL (ref 0.2–1.0)
CO2: 20 mmol/L — ABNORMAL LOW (ref 21–32)
Calcium: 8.8 MG/DL (ref 8.5–10.1)
Chloride: 107 mmol/L (ref 97–108)
Creatinine: 1.62 MG/DL — ABNORMAL HIGH (ref 0.70–1.30)
GFR est AA: >60 mL/min/{1.73_m2} (ref >60)
GFR est non-AA: 52 mL/min/{1.73_m2} — ABNORMAL LOW (ref >60)
Globulin: 4 g/dL (ref 2.0–4.0)
Glucose: 79 mg/dL (ref 65–100)
Potassium: 4.3 mmol/L (ref 3.5–5.1)
Protein, total: 8.4 g/dL — ABNORMAL HIGH (ref 6.4–8.2)
Sodium: 137 mmol/L (ref 136–145)

## 2020-08-01 LAB — CBC W/O DIFF
ABSOLUTE NRBC: 0 10*3/uL (ref 0.00–0.01)
HCT: 40.7 % (ref 36.6–50.3)
HGB: 12.9 g/dL (ref 12.1–17.0)
MCH: 23.6 PG — ABNORMAL LOW (ref 26.0–34.0)
MCHC: 31.7 g/dL (ref 30.0–36.5)
MCV: 74.4 FL — ABNORMAL LOW (ref 80.0–99.0)
MPV: 10.7 FL (ref 8.9–12.9)
NRBC: 0 PER 100 WBC
PLATELET: 234 10*3/uL (ref 150–400)
RBC: 5.47 M/uL (ref 4.10–5.70)
RDW: 14.9 % — ABNORMAL HIGH (ref 11.5–14.5)
WBC: 12.4 10*3/uL — ABNORMAL HIGH (ref 4.1–11.1)

## 2020-08-01 LAB — TROPONIN I
Troponin-I, Qt.: <0.05 ng/mL (ref <0.05)
Troponin-I, Qt.: <0.05 ng/mL (ref <0.05)
Troponin-I, Qt.: <0.05 ng/mL (ref <0.05)

## 2020-08-01 LAB — PROTHROMBIN TIME + INR
INR: 1.2 — ABNORMAL HIGH (ref 0.9–1.1)
Prothrombin time: 12.4 s — ABNORMAL HIGH (ref 9.0–11.1)

## 2020-08-01 LAB — COVID-19 RAPID TEST: COVID-19 rapid test: NOT DETECTED

## 2020-08-01 LAB — C REACTIVE PROTEIN, QT: C-Reactive protein: 2.77 mg/dL — ABNORMAL HIGH (ref 0.00–0.60)

## 2020-08-01 LAB — D DIMER: D-dimer: 1.22 mg/L FEU — ABNORMAL HIGH (ref 0.00–0.65)

## 2020-08-01 LAB — URINE CULTURE HOLD SAMPLE

## 2020-08-01 MED ORDER — LORAZEPAM 2 MG/ML IJ SOLN
2 mg/mL | INTRAMUSCULAR | Status: AC
Start: 2020-08-01 — End: 2020-08-01
  Administered 2020-08-01: 05:00:00 via INTRAVENOUS

## 2020-08-01 MED ORDER — SODIUM CHLORIDE 0.9 % IV
500 mg/5 mL | Freq: Two times a day (BID) | INTRAVENOUS | Status: DC
Start: 2020-08-01 — End: 2020-08-02
  Administered 2020-08-01 – 2020-08-02 (×3): via INTRAVENOUS

## 2020-08-01 MED ORDER — LORAZEPAM 2 MG/ML IJ SOLN
2 mg/mL | INTRAMUSCULAR | Status: DC
Start: 2020-08-01 — End: 2020-08-01

## 2020-08-01 MED ORDER — SODIUM CHLORIDE 0.9 % IV
1 gram | INTRAVENOUS | Status: AC
Start: 2020-08-01 — End: 2020-08-01
  Administered 2020-08-01: 10:00:00 via INTRAVENOUS

## 2020-08-01 MED ORDER — SODIUM CHLORIDE 0.9 % IV
1 gram | Freq: Three times a day (TID) | INTRAVENOUS | Status: DC
Start: 2020-08-01 — End: 2020-08-01

## 2020-08-01 MED ORDER — ACETAMINOPHEN 325 MG TABLET
325 mg | Freq: Four times a day (QID) | ORAL | Status: DC | PRN
Start: 2020-08-01 — End: 2020-08-03

## 2020-08-01 MED ORDER — SODIUM CHLORIDE 0.9 % IV
500 mg/5 mL | INTRAVENOUS | Status: AC
Start: 2020-08-01 — End: 2020-08-01
  Administered 2020-08-01: 05:00:00 via INTRAVENOUS

## 2020-08-01 MED ORDER — PHARMACY VANCOMYCIN NOTE
Status: DC
Start: 2020-08-01 — End: 2020-08-01

## 2020-08-01 MED ORDER — SODIUM CHLORIDE 0.9 % IV
INTRAVENOUS | Status: DC
Start: 2020-08-01 — End: 2020-08-03
  Administered 2020-08-02 (×2): via INTRAVENOUS

## 2020-08-01 MED ORDER — SODIUM CHLORIDE 0.9 % IJ SYRG
INTRAMUSCULAR | Status: DC | PRN
Start: 2020-08-01 — End: 2020-08-03

## 2020-08-01 MED ORDER — SODIUM CHLORIDE 0.9 % IV
500 mg PE/10 mL | Freq: Three times a day (TID) | INTRAVENOUS | Status: DC
Start: 2020-08-01 — End: 2020-08-02
  Administered 2020-08-01 – 2020-08-02 (×4): via INTRAVENOUS

## 2020-08-01 MED ORDER — LEVETIRACETAM 500 MG/5 ML IV SOLN
5005 mg/5 mL | Freq: Two times a day (BID) | INTRAVENOUS | Status: DC
Start: 2020-08-01 — End: 2020-08-01
  Administered 2020-08-01: 06:00:00 via INTRAVENOUS

## 2020-08-01 MED ORDER — VANCOMYCIN IN 0.9 % SODIUM CHLORIDE 1.75 GRAM/500 ML IV
1.75 gram/500 mL | Freq: Once | INTRAVENOUS | Status: AC
Start: 2020-08-01 — End: 2020-08-01
  Administered 2020-08-01: 15:00:00 via INTRAVENOUS

## 2020-08-01 MED ORDER — LORAZEPAM 2 MG/ML IJ SOLN
2 mg/mL | Freq: Once | INTRAMUSCULAR | Status: DC
Start: 2020-08-01 — End: 2020-08-01

## 2020-08-01 MED ORDER — SODIUM CHLORIDE 0.9% BOLUS IV
0.9 % | Freq: Once | INTRAVENOUS | Status: AC
Start: 2020-08-01 — End: 2020-08-01
  Administered 2020-08-01: 04:00:00 via INTRAVENOUS

## 2020-08-01 MED ORDER — SODIUM CHLORIDE 0.9 % IJ SYRG
Freq: Three times a day (TID) | INTRAMUSCULAR | Status: DC
Start: 2020-08-01 — End: 2020-08-03
  Administered 2020-08-01 – 2020-08-03 (×8): via INTRAVENOUS

## 2020-08-01 MED ORDER — ACETAMINOPHEN 650 MG RECTAL SUPPOSITORY
650 mg | Freq: Four times a day (QID) | RECTAL | Status: DC | PRN
Start: 2020-08-01 — End: 2020-08-03

## 2020-08-01 MED ORDER — LORAZEPAM 2 MG/ML IJ SOLN
2 mg/mL | INTRAMUSCULAR | Status: AC
Start: 2020-08-01 — End: 2020-08-01

## 2020-08-01 MED ORDER — VANCOMYCIN IN 0.9 % SODIUM CHLORIDE 1.25 GRAM/250 ML IV
1.25 gram/250 mL | Freq: Two times a day (BID) | INTRAVENOUS | Status: DC
Start: 2020-08-01 — End: 2020-08-01

## 2020-08-01 MED ORDER — ALBUTEROL SULFATE 0.083 % (0.83 MG/ML) SOLN FOR INHALATION
2.5 mg /3 mL (0.083 %) | RESPIRATORY_TRACT | Status: DC | PRN
Start: 2020-08-01 — End: 2020-08-03

## 2020-08-01 MED ORDER — POLYETHYLENE GLYCOL 3350 17 GRAM (100 %) ORAL POWDER PACKET
17 gram | Freq: Every day | ORAL | Status: DC | PRN
Start: 2020-08-01 — End: 2020-08-03

## 2020-08-01 MED ORDER — LORAZEPAM 2 MG/ML IJ SOLN
2 mg/mL | INTRAMUSCULAR | Status: AC
Start: 2020-08-01 — End: 2020-08-01
  Administered 2020-08-01: 04:00:00 via INTRAVENOUS

## 2020-08-01 MED ORDER — SODIUM CHLORIDE 0.9 % IV
500 mg PE/10 mL | INTRAVENOUS | Status: AC
Start: 2020-08-01 — End: 2020-08-01
  Administered 2020-08-01: 06:00:00 via INTRAVENOUS

## 2020-08-01 MED FILL — LORAZEPAM 2 MG/ML IJ SOLN: 2 mg/mL | INTRAMUSCULAR | Qty: 1

## 2020-08-01 MED FILL — FOSPHENYTOIN 500 MG PE/10 ML INJECTION: 500 mg PE/10 mL | INTRAMUSCULAR | Qty: 30

## 2020-08-01 MED FILL — SODIUM CHLORIDE 0.9 % IV: INTRAVENOUS | Qty: 1000

## 2020-08-01 MED FILL — LEVETIRACETAM 500 MG/5 ML IV SOLN: 500 mg/5 mL | INTRAVENOUS | Qty: 20

## 2020-08-01 MED FILL — CEREBYX 500 MG PE/10 ML INJECTION SOLUTION: 500 mg PE/10 mL | INTRAMUSCULAR | Qty: 6

## 2020-08-01 MED FILL — LEVETIRACETAM 500 MG/5 ML IV SOLN: 500 mg/5 mL | INTRAVENOUS | Qty: 15

## 2020-08-01 MED FILL — MONOJECT PREFILL ADVANCED 0.9 % SODIUM CHLORIDE INJECTION SYRINGE: INTRAMUSCULAR | Qty: 40

## 2020-08-01 MED FILL — VANCOMYCIN 10 GRAM IV SOLR: 10 gram | INTRAVENOUS | Qty: 1750

## 2020-08-01 MED FILL — LORAZEPAM 2 MG/ML IJ SOLN: 2 mg/mL | INTRAMUSCULAR | Qty: 2

## 2020-08-01 MED FILL — PHARMACY VANCOMYCIN NOTE: Qty: 1

## 2020-08-01 MED FILL — MEROPENEM 1 GRAM IV SOLR: 1 gram | INTRAVENOUS | Qty: 2

## 2020-08-01 NOTE — ED Notes (Signed)
RT unable to obtain ABG due to patient inability to lie still for the draw

## 2020-08-01 NOTE — Progress Notes (Signed)
STAT EEG completed.

## 2020-08-01 NOTE — ED Notes (Signed)
Pt straight cathed for urine due to current state. Pt had to be held by three RNs. Urine collected and sent to lab. Pt resting quietly at this time, chest rise visible

## 2020-08-01 NOTE — Consults (Signed)
Consult dictated.  24 year old male with history of epilepsy, concern of noncompliance with medications admitted after multiple seizures and received multiple doses of Versed, Ativan, was loaded with Keppra and fosphenytoin.  Clinically he is awake but appears postictal not following commands, somewhat restless.  Also had fever of 102.6 which has subsided to 99.1 this morning.  Mild leukocytosis.  Maintain Keppra and fosphenytoin, EEG did not show any ongoing seizures.  Follow clinically, if fever does not resolve and no source found, will need CSF analysis.  Avoid Meropenem.    Lance Bosch, MD

## 2020-08-01 NOTE — ED Notes (Signed)
Pt is resting quietly at this time, chest rise visible. Pt attached to Pulse ox only at this time due to excessive movement and potential cause of harm from wires. All medications have been given at pt appears to be more calm at this time. RN will continue to monitor. EEG tech called in for pending testing.

## 2020-08-01 NOTE — Progress Notes (Signed)
1850: TRANSFER - IN REPORT:    Verbal report received from Missy, RN(name) on Samuel Rosario  being received from ED(unit) for routine progression of care      Report consisted of patient's Situation, Background, Assessment and   Recommendations(SBAR).     Information from the following report(s) SBAR, Kardex, Intake/Output, MAR, Recent Results, Med Rec Status, Cardiac Rhythm SR and Dual Neuro Assessment was reviewed with the receiving nurse.    Opportunity for questions and clarification was provided.      Assessment completed upon patient's arrival to unit and care assumed.           2000: Bedside and Verbal shift change report given to Herbert Seta, Charity fundraiser (Cabin crew) by Renae Fickle, RN (offgoing nurse). Report included the following information SBAR, Kardex, Intake/Output, MAR, Recent Results, Med Rec Status, Cardiac Rhythm SR and Dual Neuro Assessment.

## 2020-08-01 NOTE — ED Notes (Signed)
RT called for ABG.

## 2020-08-01 NOTE — Progress Notes (Signed)
 Pharmacist Note - Vancomycin Dosing    Consult provided for this 24 y.o. male for indication of CNS infection.  Antibiotic regimen(s): Vanc and Merrem x1 dose      Recent Labs     08/01/20  0607 08/01/20  0055   WBC 12.4* 13.1*   CREA 1.38* 1.62*   BUN 12 13     Frequency of BMP: daily thru 8/20  Height: 175.3 cm  Weight: 72.6 kg  Est CrCl: 80-85 ml/min; UO: - ml/kg/hr  Temp (24hrs), Avg:100.9 F (38.3 C), Min:99.1 F (37.3 C), Max:102.6 F (39.2 C)    Cultures:  pending    MRSA Swab ordered (if applicable)? N/A    The plan below is expected to result in a target range of Trough 10-15 mcg/mL    Therapy will be initiated with a loading dose of 1750 mg IV x 1 to be followed by a maintenance dose of 1250 mg IV every 12 hours.  Pharmacy to follow patient daily and order levels / make dose adjustments as appropriate.

## 2020-08-01 NOTE — ED Notes (Signed)
Assumed care of patient. Verbal and bedside report received from [facility]  . Patient resting quietly on stretcher. Pt appears in no acute distress, respirations equal and unlabored. VS WNL. Call bell within reach.

## 2020-08-01 NOTE — Consults (Signed)
Consults  by Samuel Rosario, Samuel Shaggy, NP at 08/01/20 0209                Author: Phoua Rosario, Samuel Shaggy, NP  Service: Neurology  Author Type: Nurse Practitioner       Filed: 08/01/20 0610  Date of Service: 08/01/20 0209  Status: Addendum          Editor: Samuel Rosario, Samuel Shaggy, NP (Nurse Practitioner)       Related Notes: Original Note by Samuel Rosario, Samuel Shaggy, NP (Nurse Practitioner) filed at 08/01/20  0407          Cosigner: Samuel Bosch, MD at 08/01/20 1050                       Neurology Consult   Samuel Rosario Samuel Dollins, NP          Patient: Samuel Rosario  MRN: [id number]   SSN: [id number]          Date of Birth: 1995-12-24   Age: 24 y.o.   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 EMS witness seizure activity en route and administered 5 mg versed IM. On arrival  to ED, pt had a total of 15 seizures per staff without returning to his neurologic baseline. He was given a grand total of 10 mg ativan, 4 g Keppra load, and fosphenytoin load. CT head was performed which was negative for acute process. He was noted to  febrile with a temp of 102.4. Per chart review, he sees neurology at the [facility] and at [facility]. He is supposed to be on Keppra at home but has a hx of med non-compliance. On my assessment, pt noted to be post ictal, urinated on himself during exam.         Past Medical History:        Diagnosis  Date         ?  PTSD (post-traumatic stress disorder)           ?  Seizures (HCC)          History reviewed. No pertinent family history.     Social History          Tobacco Use         ?  Smoking status:  Current Every Day Smoker     ?  Smokeless tobacco:  Never Used       Substance Use Topics         ?  Alcohol use:  Not Currently          No Known Allergies      Review of Systems:   Review of systems not obtained due to patient factors./AMS      Objective:          Vitals:           08/01/20 0040  08/01/20 0100         BP:    110/64     Pulse:  (!) 101  98     Resp:  20  22     Temp:  (!) 102.4 ??F (39.1 ??C)  SpO2:  100%  100%         Physical Exam:   GENERAL: Ill appearing AA adult male   SKIN: Warm, dry, color appropriate for ethnicity.    NEURO: Lethargic. Grimaces to pain. No speech. Does not follow commands. Pupils are 4 mm and sluggishly reactive bilaterally. No disconjugate gaze present. Does not blink to threat bilaterally. Face is grossly symmetric. MAEs spontaneously with equal  strength. Bulk and tone normal. No involuntary movements. Gait deferred.      Labs:     Lab Results         Component  Value  Date/Time            WBC  13.1 (H)  08/01/2020 12:55 AM       HGB  12.8  08/01/2020 12:55 AM       HCT  41.7  08/01/2020 12:55 AM       PLATELET  245  08/01/2020 12:55 AM            MCV  74.2 (L)  08/01/2020 12:55 AM           Lab Results         Component  Value  Date/Time            Sodium  137  05/01/2020 05:49 PM       Potassium  3.5  05/01/2020 05:49 PM       Chloride  106  05/01/2020 05:49 PM       CO2  21  05/01/2020 05:49 PM       Anion gap  10  05/01/2020 05:49 PM       Glucose  87  05/01/2020 05:49 PM       BUN  7  05/01/2020 05:49 PM       Creatinine  1.33 (H)  05/01/2020 05:49 PM       BUN/Creatinine ratio  5 (L)  05/01/2020 05:49 PM       GFR est AA  >60  05/01/2020 05:49 PM       GFR est non-AA  >60  05/01/2020 05:49 PM            Calcium  9.1  05/01/2020 05:49 PM        Imaging:   CT Results (most recent):   Results from Hospital Encounter encounter on 08/01/20      CT HEAD WO CONT      Narrative   EXAM:  CT head without contrast      INDICATION: Recurrent seizures      COMPARISON: CT 05/01/2020      TECHNIQUE: Noncontrast head CT. Coronal and sagittal reformats. CT dose   reduction was achieved through use of a standardized protocol tailored for this   examination and automatic exposure control for dose modulation. Adaptive    statistical iterative reconstruction (ASIR) was utilized.      FINDINGS: The ventricles and sulci are age-appropriate without hydrocephalus.   There is no mass effect or midline shift. There is no intracranial hemorrhage or   extra-axial fluid collection. There is no abnormal parenchymal attenuation. The   gray-white matter differentiation is maintained. The basal cisterns are patent.      The osseous structures are intact. There is a small mucus retention cyst versus   polyp in the right sphenoid sinus. The remaining paranasal sinuses and mastoid   air cells are clear.      Impression   No acute intracranial abnormality.  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 Lauree Rosario.       Thank you for this consult and participating in the care of this patient.      Signed By:  Samuel James, NP           August 01, 2020

## 2020-08-01 NOTE — ED Provider Notes (Signed)
Samuel Rosario is a 24 y.o. male with past medical history notable for seizures, PTSD presenting with multiple seizures this past evening.  Per EMS who provided the entirety of the history for this ED encounter he had missed 1 day of his medication and had multiple seizures without returning to baseline starting this evening.  There was no known trauma.  Patient is unable to provide any history due to the acuity of symptoms and his altered mental status due to ongoing seizure activity.  EMS providers noted that he did not appear to have tonic-clonic seizures but "focal seizures".  Did not have lasting sensation after 5 mg of intramuscular Versed.    History obtained from medical record, EMS providers.            Past Medical History:   Diagnosis Date   ??? PTSD (post-traumatic stress disorder)    ??? Seizures (HCC)        Past Surgical History:   Procedure Laterality Date   ??? HX OTHER SURGICAL      mass removal from chest          History reviewed. No pertinent family history.    Social History     Socioeconomic History   ??? Marital status: SINGLE     Spouse name: Not on file   ??? Number of children: Not on file   ??? Years of education: Not on file   ??? Highest education level: Not on file   Occupational History   ??? Not on file   Tobacco Use   ??? Smoking status: Current Every Day Smoker   ??? Smokeless tobacco: Never Used   Substance and Sexual Activity   ??? Alcohol use: Not Currently   ??? Drug use: Not Currently   ??? Sexual activity: Yes     Partners: Female   Other Topics Concern   ??? Not on file   Social History Narrative   ??? Not on file     Social Determinants of Health     Financial Resource Strain:    ??? Difficulty of Paying Living Expenses:    Food Insecurity:    ??? Worried About Programme researcher, broadcasting/film/video in the Last Year:    ??? Barista in the Last Year:    Transportation Needs:    ??? Freight forwarder (Medical):    ??? Lack of Transportation (Non-Medical):    Physical Activity:    ??? Days of Exercise per Week:    ??? Minutes  of Exercise per Session:    Stress:    ??? Feeling of Stress :    Social Connections:    ??? Frequency of Communication with Friends and Family:    ??? Frequency of Social Gatherings with Friends and Family:    ??? Attends Religious Services:    ??? Database administrator or Organizations:    ??? Attends Engineer, structural:    ??? Marital Status:    Intimate Programme researcher, broadcasting/film/video Violence:    ??? Fear of Current or Ex-Partner:    ??? Emotionally Abused:    ??? Physically Abused:    ??? Sexually Abused:          ALLERGIES: Patient has no known allergies.    Review of Systems   Unable to perform ROS: Mental status change       Vitals:    08/01/20 0040 08/01/20 0100   BP:  110/64   Pulse: (!) 101 98   Resp: 20  22   Temp: (!) 102.4 ??F (39.1 ??C)    SpO2: 100% 100%            Physical Exam  Constitutional:       Appearance: He is ill-appearing.   HENT:      Head: Atraumatic.      Mouth/Throat:      Mouth: Mucous membranes are moist.   Eyes:      Extraocular Movements: Extraocular movements intact.      Pupils: Pupils are equal, round, and reactive to light.      Comments: During seizure activity tonic eye deviation noted to the left   Cardiovascular:      Rate and Rhythm: Tachycardia present.      Pulses: Normal pulses.   Pulmonary:      Effort: Pulmonary effort is normal.      Comments: Tachypnea noted  Abdominal:      General: Abdomen is flat.   Musculoskeletal:      Right lower leg: No edema.      Left lower leg: No edema.   Skin:     General: Skin is warm.      Capillary Refill: Capillary refill takes less than 2 seconds.   Neurological:      Comments: Patient confused, all extremities well, no tonic-clonic movements, during presumed seizure activity he had tonic eye deviation and decreased response to external stimulus.  Muscle tone appeared to be normal.  No fasciculations or posturing was noted.          MDM  Number of Diagnoses or Management Options  Diagnosis management comments: Patient presented with acute altered mental status in the  context of multiple seizures, he had multiple spells presumed to be focal seizures and had minimal improvement with 2 mg of Ativan after receiving Versed recently, was given 4 mg x 2.  He gradually improved in terms of the length of the spells after Keppra load and Ativan.  Discussed with Dr. Wynelle Link early in his course.  Also discussed with Dr. Lauree Chandler from neurology.  Over the course of his observation his mental status did seem to improve although still quite confused but was able to verbalize which she was not able to for the first several hours of his ED course.  Therefore was decided that pursuing an LP immediately despite his fever given normal hemodynamic parameters, lack of tachycardia, hypotension, only mild lactic acidosis despite lengthy presumed seizure activity, no apparent focal source of fever, no apparent meningismus and no rash would not be advisable.  Procalcitonin, other laboratories were not revealing of signs of infection.  Therefore antibiotics were not administered initially.  They also would potentially diminish the yield of CSF culture if was obtained.  He was able to maintain his airway reflexes throughout his ED stay, he was tachypneic initially and subsequently had a normal respiratory rate.  He did not require airway interventions.        2:53 PM     I have spent 79 minutes of critical care time involved in lab review, consultations with specialist, family decision-making, and documentation.  During this entire length of time I was immediately available to the patient and/or family.    Cam Hai, MD           Amount and/or Complexity of Data Reviewed  Clinical lab tests: ordered and reviewed  Tests in the radiology section of CPT??: ordered and reviewed  Tests in the medicine section of CPT??: ordered  Obtain  history from someone other than the patient: yes (Emergency medical services provider)  Discuss the patient with other providers: yes (Dr. Wynelle Link, Dr. Lauree Chandler)    Risk of Complications,  Morbidity, and/or Mortality  Presenting problems: high  Diagnostic procedures: high  Management options: high    Patient Progress  Patient progress: improved    ED Course as of Aug 01 1425   Wed Aug 01, 2020   0352 D/w ICU DR Wynelle Link, admit to ICU depending on result of EEG -- here to perform study      [NS]      ED Course User Index  [NS] Cam Hai, MD       Procedures

## 2020-08-01 NOTE — ED Notes (Signed)
Pt arrive today via ems  Hx epilepsy  EMS reports that patient has had 15 seizures witnessed by pt girlfriend along with one witnessed seizure with ems lasted about 20-25 secs. All starting at 2200 yesterday     Pt is prescibed medication but needs a refill  .  5mg  versed IM given by EMS    Pt excessively active on arrival with non purposeful movement and  Not communicating verbally    Pt continues to roll to left side and eyes are also deviated to the left.

## 2020-08-01 NOTE — Progress Notes (Signed)
Patient without seizures all day. Still obtunded / post ictal but no cardiopulmonary issues. Fever down to 100, neck is not stiff. Will change neuro checks to q4h, transfer to hospitalist on telemetry in the neuro unit.

## 2020-08-01 NOTE — Consults (Signed)
[facility]  CONSULTATION    Name:  Rosario, Samuel  MR#:  [id number]  DOB:  Jan 08, 1996  ACCOUNT #:  [id number]  DATE OF SERVICE:  08/01/2020    REQUESTING PHYSICIAN:  Robbi Garter, NP    REASON FOR EVALUATION:  Multiple seizures.    HISTORY OF PRESENT ILLNESS:  The patient is a 24 year old male with history of TBI, seizures, and PTSD.  He was admitted after witnessed seizure-like activity per his girlfriend.  He is unable to give any historical details as he is still in a postictal confused state.  He continued to have seizure activity en route to the hospital and was given 5 mg of Versed.  In the ER, he was witnessed to have more seizure activity and received total of 10 mg of Ativan over multiple doses and was started on Keppra and fosphenytoin.  CT head was unremarkable.  He was found to be febrile with mild leukocytosis and his COVID test was negative.  Stat EEG was obtained but that did not show any ongoing seizure activity and he was admitted for further workup.  There has not been any further seizure-like activity, but his mentation has not returned to baseline.  Detailed review of systems was difficult.    PAST MEDICAL HISTORY:  As mentioned above.    HOME MEDICATIONS:  None on file.    ALLERGIES:  NONE.    SOCIAL HISTORY:  Current everyday smoker.  No alcohol use.    PHYSICAL EXAMINATION:  GENERAL:  On examination, the patient is lying in bed and was attempting to sit up.  He makes eye contact but did not respond to my questions.  Then, he lies down and pulls the covers over and appears to go to sleep.  VITAL SIGNS:  Blood pressure 109/74, temperature 99.1, pulse is 86.  NEUROLOGIC:  Pupils are equal, reactive.  Extraocular movements are full.  Face symmetric.  Muscle tone and bulk normal.  Moving all four extremities.  DTRs hypoactive.  Toes mute.  Sensory, cerebellar, gait exam were deferred.  HEART:  Regular rate and rhythm.  CHEST:  Clear.  ABDOMEN:  Soft.  Positive bowel  sounds.  EXTREMITIES:  No edema.    LABORATORY DATA:  CBC with WBC 12.4, hemoglobin 12.9, hematocrit 40.7, platelets 234.  Chemistry:  Sodium 137, potassium 4.3, BUN 13, creatinine 1.62 on admission which has improved to 1.38.  Lactic acid 2.2.  Troponin less than 0.05.  Urine drug screen positive for THC and benzodiazepines.    DIAGNOSTIC DATA:  CT brain did not show any acute abnormalities.  EEG showed slowing with occasional epileptiform potential in the left temporal region.    ASSESSMENT AND PLAN:  A 24 year old male with a known history of epilepsy, traumatic brain injury and post-traumatic stress disorder.  There is a concern that he may not be compliant with his medications.  He is admitted after multiple seizures and received multiple doses of Versed and Ativan.  He was loaded with Keppra and fosphenytoin.  Clinically, he is awake but appears postictal, not following commands and somewhat restless.  Also had a fever of 102.6 on admission which has subsided to 99.1 this morning.  There was mild leukocytosis.    We will maintain him on Keppra 1500 mg twice daily for now.  He has mild renal insufficiency, but GFR is greater than 60.  EEG earlier this morning did not show any ongoing seizure activity but shows epileptiform potential in the left  temporal region.  Continue to follow clinically and if fever does not resolve and no source is found, will need cerebrospinal fluid analysis.    Avoid medications that can lower seizure threshold including the Merrem and fluoroquinolones.    We will follow.  Thank you for this consultation.      Lance Bosch, MD      [id number]  D:  08/01/2020 10:36  T:  08/01/2020 12:49  JOB #:  [id number]

## 2020-08-01 NOTE — Progress Notes (Signed)
Progress Notes by Particia Jasper, NP at 08/01/20 1940                Author: Particia Jasper, NP  Service: Internal Medicine  Author Type: Nurse Practitioner       Filed: 08/01/20 2027  Date of Service: 08/01/20 1940  Status: Signed          Editor: Particia Jasper, NP (Nurse Practitioner)               .     [facility]         ICU Transfer/Accept Summary          This patient is being transferred OUT OF THE ICU   DATE OF TRANSFER: 08/01/2020           PATIENT ID: Samuel Rosario   MRN: [id number]    DATE OF BIRTH: Feb 11, 1996     PRIMARY CARE PROVIDER: None    DATE OF ADMISSION: 08/01/2020 12:07 AM     ATTENDING PHYSICIAN: Rebeca Allegra MD   CONSULTATIONS:    IP CONSULT TO NEUROLOGY      PROCEDURES/SURGERIES:    * No surgery found *      REASON FOR ADMISSION: Status Epilepticus      HOSPITAL PROBLEM LIST:     Patient Active Problem List        Diagnosis  Code         ?  Frequent seizures (HCC)  R56.9         ?  Status epilepticus (HCC)  G40.901                Brief HPI and Hospital Course:         HPI (exceprt from admission H&P): "24 yo AAM with a PMH of TBI, seizures and PTSD who presented to the ED via EMS after having seizure like  activity per girlfriend. Pt is post octal and unable to participate therefore information is obtained via chart and provider. Per EMS report, pt had seizure like activity en route and was given 5 mg versed. Pt also had a witnessed seizure in the ER and  was given 10 mg ativan, keppra and fosphenytoin. Dorna Bloom also included CTH which revealed no acute process. Pt was found to be febrile and mildly elevated WBCs. Rapid covid was negative. STAT EEG was ordered and neurology was consulted. ICU was consulted for  further recommendations in management. "      Hospital Course:   8/18   - Admitted to ICU service for status epilepticus   - Initiated on Broad spectrum abx - Merem and Vancomycin - for fever and leukocytosis   - Neurology consulted - Recommends  Keppra/Fosphenytoin. Recommends against meropenem   - EEG without ongoing seizure activity   - No further seizure activity, cleared for transfer out of ICU by intensivist. Hospitalist service assumes care.        Assessment and Plan:      Status epilepticus   - Known history of seizure disorder secondary to TBI. Routine care at [facility]   - CT Head without acute process   - Neurology following   - AED Regimen: Keppra/Fosphenytoin   - EEG without ongoing seizure activity but with epileptiform potential in the left temporal region   - Ativan PRN for prolonged seizure activity   - Seizure precautions      Fever with mild leukocytosis   - Tmax 102.6, WBC  13.1k, Procal 0.54   - UA, CXR negative for infectious source. COVID negative, BCx NGTD   - Started on Merem and Vancomycin in ICU, neurology recommends against seizure threshold lowering medications such as fluoroquinolones or Merem   - Will monitor off antibiotics   - Continue to monitor clinically - if recurrent fevers will may need CSF analysis/febrile illness workup, further antibiotic coverage      AKI, mild   - sCr 1.62 on admission   - Trend, IVF, avoid nephrotoxins/renally dose medications      TBI, hx   - Supportive care              PHYSICAL EXAMINATION:   Visit Vitals      BP  97/65     Pulse  81         Temp  98.8 ??F (37.1 ??C)  Comment: NSTU admission        Resp  20     Ht  5\' 9"  (1.753 m)     Wt  72.6 kg (160 lb)     SpO2  100%        BMI  23.63 kg/m??           Physical Exam   Vitals and nursing note reviewed.   Constitutional:        General: He is not in acute distress.     Appearance: He is not ill-appearing.      Comments: Drowsy, rouses to verbal stimuli    HENT :       Head: Normocephalic and atraumatic.      Mouth/Throat:      Mouth: Mucous membranes are dry.   Eyes:       General: No scleral icterus.     Extraocular Movements: Extraocular movements intact.      Pupils: Pupils are equal, round, and reactive to light.   Cardiovascular :       Rate and  Rhythm: Normal rate and regular rhythm.      Pulses: Normal pulses.      Heart sounds: Normal heart sounds.    Pulmonary:       Effort: Pulmonary effort is normal. No respiratory distress.   Abdominal:      General: Bowel sounds are normal. There is no distension.       Palpations: Abdomen is soft.      Tenderness: There is no abdominal tenderness.     Musculoskeletal:          General: Normal range of motion.      Right lower leg: No edema.      Left lower leg: No edema.    Skin:      General: Skin is warm and dry.      Capillary Refill: Capillary refill takes less than 2 seconds.    Neurological:       Mental Status: He is alert.      Comments: Drowsy, aware he is at hospital, the month, situation   Psychiatric:         Mood and Affect: Mood normal.          Behavior: Behavior normal.               Labs:          Recent Labs            08/01/20   0607  08/01/20   0055     WBC  12.4*  13.1*  HGB  12.9  12.8     HCT  40.7  41.7         PLT  234  245          Recent Labs             08/01/20   1327  08/01/20   0607  08/01/20   0055     NA   --   132*  137     K   --   HEMOLYZED,RECOLLECT REQUESTED  4.3     CL   --   106  107     CO2   --   21  20*     BUN   --   12  13     CREA   --   1.38*  1.62*     GLU   --   87  79     CA   --   8.8  8.8     MG  2.2  HEMOLYZED,RECOLLECT REQUESTED  2.1          PHOS  2.5*  3.0   --           Recent Labs           08/01/20   0055     ALT  52     AP  71     TBILI  0.5     TP  8.4*     ALB  4.4        GLOB  4.0          Recent Labs           08/01/20   0055     INR  1.2*        PTP  12.4*         No results for input(s): FE, TIBC, PSAT, FERR in the last 72 hours.    No results found for: FOL, RBCF    No results for input(s): PH, PCO2, PO2 in the last 72 hours.     Recent Labs             08/01/20   1327  08/01/20   0607  08/01/20   0055     CPK   --    --   5,185*          TROIQ  <0.05  <0.05  <0.05        No results found for: CHOL, CHOLX, CHLST, CHOLV, HDL, HDLP, LDL, LDLC,  DLDLP, TGLX, TRIGL, TRIGP, CHHD, CHHDX   No results found for: [facility]     Lab Results         Component  Value  Date/Time            Color  YELLOW/STRAW  08/01/2020 06:07 AM       Appearance  CLEAR  08/01/2020 06:07 AM       Specific gravity  1.022  08/01/2020 06:07 AM       pH (UA)  5.0  08/01/2020 06:07 AM       Protein  Negative  08/01/2020 06:07 AM       Glucose  Negative  08/01/2020 06:07 AM       Ketone  80 (A)  08/01/2020 06:07 AM       Bilirubin  Negative  08/01/2020 06:07 AM       Urobilinogen  0.2  08/01/2020 06:07 AM  Nitrites  Negative  08/01/2020 06:07 AM       Leukocyte Esterase  Negative  08/01/2020 06:07 AM       Epithelial cells  FEW  08/01/2020 06:07 AM       Bacteria  Negative  08/01/2020 06:07 AM       WBC  0-4  08/01/2020 06:07 AM            RBC  0-5  08/01/2020 06:07 AM              CODE STATUS:      X  Full Code       DNR       Partial          Comfort Care           Signed:    Particia Jasper, NP   Date of Service:  08/01/2020   7:40 PM

## 2020-08-01 NOTE — Procedures (Signed)
[facility]  EEG    Name:  Samuel Rosario, Samuel Rosario  MR#:  [id number]  DOB:  Sep 25, 1996  ACCOUNT #:  [id number]  DATE OF SERVICE:  08/01/2020      REQUESTING PHYSICIAN:  Sharlette Dense, MD    HISTORY:  The patient is a 24 year old male with history of epilepsy, who is being evaluated after multiple recurrent seizures.    DESCRIPTION:  This is an 18-channel EEG performed on a poorly-responsive patient.  There is no clear dominant background rhythm.  Background activity consists of medium voltage rhythms ranging from delta to beta range.  Beta frequencies were seen predominantly in the frontal regions.  Central slowing and poorly-formed vertex waves were seen indicating stage I sleep.  A rare sharp wave discharge was noted in the left temporal region.  Photic stimulation did not elicit driving response.  Hyperventilation was not performed.    EEG SUMMARY:  Abnormal EEG due to generalized slowing along with an occasional sharp wave discharge in the left temporal region.    CLINICAL INTERPRETATION:  This EEG is suggestive of a moderate generalized encephalopathic process, nonspecific in type.  This may be due to the effects of sedating medications.  In addition, there is a rare potentially epileptiform discharge in the left temporal region which may represent partial onset seizure focus.  However, no ongoing electrographic seizures were recorded.      Lance Bosch, MD      [id number]  D:  08/01/2020 10:39  T:  08/01/2020 13:19  JOB #:  [id number]

## 2020-08-01 NOTE — H&P (Signed)
H&P by Nila Nephew, NP at 08/01/20 [phone]                Author: Nila Nephew, NP  Service: Nurse Practitioner  Author Type: Nurse Practitioner       Filed: 08/01/20 0522  Date of Service: 08/01/20 0253  Status: Signed           Editor: Nila Nephew, NP (Nurse Practitioner)  Cosigner: Wilma Flavin, MD at 08/01/20 2146                        [facility]      ICU TEAM History and Physical         Name:  Samuel Rosario        DOB:  01-Dec-1996     MRN:  [id number]        Date:  08/01/2020           Assessment:        ICU Problems:   1.  Status epilepticus   - PMH of known seizure disorder from TBI, receives care at [facility]    - STAT EEG ordered per neurology, will follow   - Neurology consulted   - Loaded keppra 4 grams   - Cont AED regimen per neurology- on keppra 1500 mg BID, fosphenytoin 300 mg TID   - Ativan prn for seizure activity    - CT head negative for any acute process   - Seizure precautions       2.  Hx TBI   - Supportive care       3.  Febrile with leukocytosis    - Consider LP in am, send CCSF for studies   - Pro cal pending   - UA pending   - CXR pending   - Consider broad spectrum coverage, will deescalate as tolerated and appropriate       4.  Non compliance         Plan:        COVID Treatment:   a.  Rapid covid negative          Discussed Plan of Care/Code Status: Full Code   Appreciate Consultants Input Neurology    Discussed Care Plan with Bedside RN   Documentation of Current Medications   Rest of Plan Below:      F - Feeding:  No NPO    A - Analgesia: None   S - Sedation: None   T - DVT Prophylaxis: SCD's or Sequential Compression Device    H - Head of Bed: > 30 Degrees   U - Ulcer Prophylaxis: Protonix (pantoprazole)    G - Glycemic Control: Insulin   S - Spontaneous Breathing Trial: N/A   B - Bowel Regimen: Docusate (Colace)   I - Indwelling Catheter:    Tubes: None   Lines: Peripheral IV   Drains: None           Subjective:     Progress Note: 08/01/2020        Reason for ICU  Admission: Status epilepticus       HPI:   Pt is a 24 yo AAM with a PMH of TBI, seizures and PTSD who presented to the ED via EMS after having seizure like activity per girlfriend. Pt is post octal and unable to participate therefore information is obtained via chart and provider. Per EMS report,  pt had seizure like activity en route  and was given 5 mg versed. Pt also had a witnessed seizure in the ER and was given 10 mg ativan, keppra and fosphenytoin. Samuel Rosario also included CTH which revealed no acute process. Pt was found to be febrile and mildly  elevated WBCs. Rapid covid was negative. STAT EEG was ordered and neurology was consulted. ICU was consulted for further recommendations in management.       Overnight Events:    08/01/2020         POD:   * No surgery found *      S/P:            Active Problem List:           Problem List   Never Reviewed                   Codes  Class            Frequent seizures (HCC)  ICD-10-CM: R56.9   ICD-9-CM: 780.39                              Past Medical History:         has a past medical history of PTSD (post-traumatic stress disorder) and Seizures (HCC).        Past Surgical History:         has a past surgical history that includes hx other surgical.        Home Medications:          Prior to Admission medications        Not on File             Allergies/Social/Family History:        No Known Allergies      Social History          Tobacco Use         ?  Smoking status:  Current Every Day Smoker     ?  Smokeless tobacco:  Never Used       Substance Use Topics         ?  Alcohol use:  Not Currently         History reviewed. No pertinent family history.        Review of Systems:        Review of systems not obtained due to patient factors.        Objective:     Vital Signs:   Visit Vitals      BP  110/64     Pulse  98     Temp  (!) 102.4 ??F (39.1 ??C)     Resp  22        SpO2  100%           Temp (24hrs), Avg:102.4 ??F (39.1 ??C), Min:102.4 ??F (39.1 ??C), Max:102.4 ??F (39.1 ??C)                Intake/Output:    No intake or output data in the 24 hours ending 08/01/20 0253      Physical Exam:         General:   Post ictal, resting comfortably         Eyes:   Sclera anicteric. Pupils equally round and reactive to light.        Mouth/Throat:  Mucous membranes normal, oral pharynx clear        Neck:  Supple  Lungs:    Clear to auscultation bilaterally, good effort     CV:   Regular rate and rhythm,no murmur, click, rub or gallop     Abdomen:    Soft, non-tender. bowel sounds normal. non-distended     Extremities:  No cyanosis or edema     Skin:  Skin color, texture, turgor normal. no acute rash or lesions     Lymph nodes:  Cervical and supraclavicular normal     Musculoskeletal:  No swelling or deformity     Lines/Devices:   Intact, no erythema, drainage or tenderness     Neuro   Post ictal, MAE, not FC            LABS AND  DATA: Personally reviewed     Recent Labs           08/01/20   0055     WBC  13.1*     HGB  12.8     HCT  41.7        PLT  245          Recent Labs           08/01/20   0055     NA  137     K  4.3     CL  107     CO2  20*     BUN  13     CREA  1.62*     GLU  79     CA  8.8        MG  2.1          Recent Labs           08/01/20   0055     AP  71     TP  8.4*     ALB  4.4        GLOB  4.0          Recent Labs           08/01/20   0055     INR  1.2*        PTP  12.4*         No results for input(s): PHI, PCO2I, PO2I, FIO2I in the last 72 hours.     Recent Labs           08/01/20   0055     CPK  5,185*        TROIQ  <0.05           Hemodynamics:         PAP:     CO:        Wedge:     CI:        CVP:      SVR:                 PVR:           Ventilator Settings:          Mode  Rate  Tidal Volume  Pressure  FiO2  PEEP                                        Peak airway pressure:            Minute ventilation:               MEDS: Reviewed  Chest X-Ray:     CXR Results   (Last 48 hours)          None               Multidisciplinary Rounds Completed:  Pending      ABCDEF  Bundle/Checklist Completed:   Yes      SPECIAL EQUIPMENT   None      DISPOSITION   Stay in ICU      CRITICAL CARE CONSULTANT NOTE   I had a face to face encounter with the patient, reviewed and interpreted patient data including clinical events, labs, images, vital signs, I/O's, and examined patient.  I have discussed the case and  the plan and management of the patient's care with the consulting services, the bedside nurses and the respiratory therapist.        NOTE OF PERSONAL INVOLVEMENT IN CARE    This patient has a high probability of imminent, clinically significant deterioration, which requires the highest level of preparedness to intervene urgently. I participated in the decision-making and  personally managed or directed the management of the following life and organ supporting interventions that required my frequent assessment to treat or prevent imminent deterioration.      I personally spent 75 minutes of critical care time.  This is time spent at this critically ill patient's bedside actively involved in patient care as well as the coordination of care and discussions  with the patient's family.  This does not include any procedural time which has been billed separately.      Nila Nephew, NP        [facility]

## 2020-08-02 LAB — POCT GLUCOSE
POC Glucose: 84 mg/dL (ref 65–117)
POC Glucose: 96 mg/dL (ref 65–117)
POC Glucose: 165 mg/dL — ABNORMAL HIGH (ref 65–117)

## 2020-08-02 LAB — COMPREHENSIVE METABOLIC PANEL
ALT: 67 U/L (ref 12–78)
AST: 301 U/L — ABNORMAL HIGH (ref 15–37)
Albumin/Globulin Ratio: 1.1 (ref 1.1–2.2)
Albumin: 3.7 g/dL (ref 3.5–5.0)
Alkaline Phosphatase: 56 U/L (ref 45–117)
Anion Gap: 8 mmol/L (ref 5–15)
BUN: 10 MG/DL (ref 6–20)
Bun/Cre Ratio: 10 — ABNORMAL LOW (ref 12–20)
CO2: 20 mmol/L — ABNORMAL LOW (ref 21–32)
Calcium: 8.3 MG/DL — ABNORMAL LOW (ref 8.5–10.1)
Chloride: 109 mmol/L — ABNORMAL HIGH (ref 97–108)
Creatinine: 1.03 MG/DL (ref 0.70–1.30)
EGFR IF NonAfrican American: >60 mL/min/{1.73_m2} (ref >60)
GFR African American: >60 mL/min/{1.73_m2} (ref >60)
Globulin: 3.4 g/dL (ref 2.0–4.0)
Glucose: 73 mg/dL (ref 65–100)
Potassium: 3.8 mmol/L (ref 3.5–5.1)
Sodium: 137 mmol/L (ref 136–145)
Total Bilirubin: 0.9 MG/DL (ref 0.2–1.0)
Total Protein: 7.1 g/dL (ref 6.4–8.2)

## 2020-08-02 LAB — CBC WITH AUTO DIFFERENTIAL
Basophils %: 0 % (ref 0–1)
Basophils Absolute: 0 10*3/uL (ref 0.0–0.1)
Eosinophils %: 0 % (ref 0–7)
Eosinophils Absolute: 0 10*3/uL (ref 0.0–0.4)
Granulocyte Absolute Count: 0 10*3/uL (ref 0.00–0.04)
Hematocrit: 38.3 % (ref 36.6–50.3)
Hemoglobin: 11.8 g/dL — ABNORMAL LOW (ref 12.1–17.0)
Immature Granulocytes: 0 % (ref 0.0–0.5)
Lymphocytes %: 17 % (ref 12–49)
Lymphocytes Absolute: 1.2 10*3/uL (ref 0.8–3.5)
MCH: 22.6 PG — ABNORMAL LOW (ref 26.0–34.0)
MCHC: 30.8 g/dL (ref 30.0–36.5)
MCV: 73.5 FL — ABNORMAL LOW (ref 80.0–99.0)
MPV: 11 FL (ref 8.9–12.9)
Monocytes %: 10 % (ref 5–13)
Monocytes Absolute: 0.7 10*3/uL (ref 0.0–1.0)
NRBC Absolute: 0 10*3/uL (ref 0.00–0.01)
Neutrophils %: 73 % (ref 32–75)
Neutrophils Absolute: 5.1 10*3/uL (ref 1.8–8.0)
Nucleated RBCs: 0 PER 100 WBC
Platelets: 213 10*3/uL (ref 150–400)
RBC: 5.21 M/uL (ref 4.10–5.70)
RDW: 14.5 % (ref 11.5–14.5)
WBC: 7 10*3/uL (ref 4.1–11.1)

## 2020-08-02 LAB — LEGIONELLA ANTIGEN, URINE: L. pneumophila Serogp 1 Ur Ag: NEGATIVE

## 2020-08-02 LAB — PHOSPHORUS
Phosphorus: 2.3 MG/DL — ABNORMAL LOW (ref 2.6–4.7)
Phosphorus: 2.3 MG/DL — ABNORMAL LOW (ref 2.6–4.7)

## 2020-08-02 LAB — MAGNESIUM
Magnesium: 2.2 mg/dL (ref 1.6–2.4)
Magnesium: 2.2 mg/dL (ref 1.6–2.4)

## 2020-08-02 LAB — PROCALCITONIN
Procalcitonin: 0.41 ng/mL
Procalcitonin: 0.41 ng/mL

## 2020-08-02 LAB — C-REACTIVE PROTEIN: CRP: 5.5 mg/dL — ABNORMAL HIGH (ref 0.00–0.60)

## 2020-08-02 LAB — LEGIONELLA PNEUMOPHILA AG, URINE: L pneumophila S1 Ag, urine: NEGATIVE

## 2020-08-02 LAB — CBC WITH AUTOMATED DIFF
ABS. BASOPHILS: 0 10*3/uL (ref 0.0–0.1)
ABS. EOSINOPHILS: 0 10*3/uL (ref 0.0–0.4)
ABS. IMM. GRANS.: 0 10*3/uL (ref 0.00–0.04)
ABS. LYMPHOCYTES: 1.2 10*3/uL (ref 0.8–3.5)
ABS. MONOCYTES: 0.7 10*3/uL (ref 0.0–1.0)
ABS. NEUTROPHILS: 5.1 10*3/uL (ref 1.8–8.0)
ABSOLUTE NRBC: 0 10*3/uL (ref 0.00–0.01)
BASOPHILS: 0 % (ref 0–1)
EOSINOPHILS: 0 % (ref 0–7)
HCT: 38.3 % (ref 36.6–50.3)
HGB: 11.8 g/dL — ABNORMAL LOW (ref 12.1–17.0)
IMMATURE GRANULOCYTES: 0 % (ref 0.0–0.5)
LYMPHOCYTES: 17 % (ref 12–49)
MCH: 22.6 PG — ABNORMAL LOW (ref 26.0–34.0)
MCHC: 30.8 g/dL (ref 30.0–36.5)
MCV: 73.5 FL — ABNORMAL LOW (ref 80.0–99.0)
MONOCYTES: 10 % (ref 5–13)
MPV: 11 FL (ref 8.9–12.9)
NEUTROPHILS: 73 % (ref 32–75)
NRBC: 0 PER 100 WBC
PLATELET: 213 10*3/uL (ref 150–400)
RBC: 5.21 M/uL (ref 4.10–5.70)
RDW: 14.5 % (ref 11.5–14.5)
WBC: 7 10*3/uL (ref 4.1–11.1)

## 2020-08-02 LAB — METABOLIC PANEL, COMPREHENSIVE
A-G Ratio: 1.1 (ref 1.1–2.2)
ALT (SGPT): 67 U/L (ref 12–78)
AST (SGOT): 301 U/L — ABNORMAL HIGH (ref 15–37)
Albumin: 3.7 g/dL (ref 3.5–5.0)
Alk. phosphatase: 56 U/L (ref 45–117)
Anion gap: 8 mmol/L (ref 5–15)
BUN/Creatinine ratio: 10 — ABNORMAL LOW (ref 12–20)
BUN: 10 MG/DL (ref 6–20)
Bilirubin, total: 0.9 MG/DL (ref 0.2–1.0)
CO2: 20 mmol/L — ABNORMAL LOW (ref 21–32)
Calcium: 8.3 MG/DL — ABNORMAL LOW (ref 8.5–10.1)
Chloride: 109 mmol/L — ABNORMAL HIGH (ref 97–108)
Creatinine: 1.03 MG/DL (ref 0.70–1.30)
GFR est AA: >60 mL/min/{1.73_m2} (ref >60)
GFR est non-AA: >60 mL/min/{1.73_m2} (ref >60)
Globulin: 3.4 g/dL (ref 2.0–4.0)
Glucose: 73 mg/dL (ref 65–100)
Potassium: 3.8 mmol/L (ref 3.5–5.1)
Protein, total: 7.1 g/dL (ref 6.4–8.2)
Sodium: 137 mmol/L (ref 136–145)

## 2020-08-02 LAB — C REACTIVE PROTEIN, QT: C-Reactive protein: 5.5 mg/dL — ABNORMAL HIGH (ref 0.00–0.60)

## 2020-08-02 LAB — GLUCOSE, POC
Glucose (POC): 84 mg/dL (ref 65–117)
Glucose (POC): 96 mg/dL (ref 65–117)
Glucose (POC): 165 mg/dL — ABNORMAL HIGH (ref 65–117)

## 2020-08-02 MED ORDER — POTASSIUM & SODIUM PHOSPHATES 280 MG-160 MG-250 MG ORAL POWDER PACKET
280-160-250 mg | Freq: Four times a day (QID) | ORAL | Status: DC
Start: 2020-08-02 — End: 2020-08-03
  Administered 2020-08-02 – 2020-08-03 (×4): via ORAL

## 2020-08-02 MED ORDER — BENZOCAINE-ZINC CHLOR-BENZALKONIUM CHLOR 20 %-0.1 %-0.02 % MUCOSAL GEL
Status: DC | PRN
Start: 2020-08-02 — End: 2020-08-03
  Administered 2020-08-02 (×2): via TOPICAL

## 2020-08-02 MED ORDER — SODIUM CHLORIDE 0.9 % IV
5005 mg/5 mL | Freq: Two times a day (BID) | INTRAVENOUS | Status: DC
Start: 2020-08-02 — End: 2020-08-03
  Administered 2020-08-03 (×2): via INTRAVENOUS

## 2020-08-02 MED FILL — LEVETIRACETAM 500 MG/5 ML IV SOLN: 500 mg/5 mL | INTRAVENOUS | Qty: 10

## 2020-08-02 MED FILL — PHOS-NAK 280 MG-160 MG-250 MG ORAL POWDER PACKET: 280-160-250 mg | ORAL | Qty: 1

## 2020-08-02 MED FILL — FOSPHENYTOIN 500 MG PE/10 ML INJECTION: 500 mg PE/10 mL | INTRAMUSCULAR | Qty: 6

## 2020-08-02 MED FILL — VANCOMYCIN IN 0.9 % SODIUM CHLORIDE 1.25 GRAM/250 ML IV: 1.25 gram/250 mL | INTRAVENOUS | Qty: 250

## 2020-08-02 MED FILL — LEVETIRACETAM 500 MG/5 ML IV SOLN: 500 mg/5 mL | INTRAVENOUS | Qty: 15

## 2020-08-02 MED FILL — ORAJEL 20 %-0.1 %-0.02 % MUCOSAL GEL: Qty: 11.9

## 2020-08-02 MED FILL — CEREBYX 500 MG PE/10 ML INJECTION SOLUTION: 500 mg PE/10 mL | INTRAMUSCULAR | Qty: 6

## 2020-08-02 NOTE — Progress Notes (Signed)
Bedside shift change report given to Paul, RN (oncoming nurse) by Heather, RN (offgoing nurse). Report included the following information SBAR, Cardiac Rhythm nsr and Dual Neuro Assessment.

## 2020-08-02 NOTE — Progress Notes (Signed)
SUBJECTIVE  Samuel Rosario is a 24 y.o. male  with a known history of epilepsy, traumatic brain injury and post-traumatic stress disorder.  There is a concern that he may not be compliant with his medications.  He is admitted after multiple seizures and received multiple doses of Versed and Ativan.    He was loaded with Keppra and fosphenytoin.   Has not had any further seizures.  Now awake and alert  Was febrile on admission but now afebrile.  Being monitored off antibiotics.    EEG earlier this morning did not show any ongoing seizure activity but shows epileptiform potential in the left temporal region.      EXAM  Exam:  Visit Vitals  BP 119/64   Pulse 69   Temp 98.9 ??F (37.2 ??C)   Resp 19   Ht '5\' 9"'$  (1.753 m)   Wt 160 lb (72.6 kg)   SpO2 100%   BMI 23.63 kg/m??     Gen:  CV: RRR  Lungs: non labored breathing  Abd: non distending  Neuro: A&O x 3, no dysarthria or aphasia  CN II-XII: PERRL, EOMI, face symmetric, tongue/palate midline  Motor: strength 5/5 all four ext  Sensory: intact to LT  Gait: Deferred    LABS/ IMAGING  Lab Results   Component Value Date/Time    WBC 7.0 08/02/2020 01:45 AM    HGB 11.8 (L) 08/02/2020 01:45 AM    HCT 38.3 08/02/2020 01:45 AM    PLATELET 213 08/02/2020 01:45 AM    MCV 73.5 (L) 08/02/2020 01:45 AM     Lab Results   Component Value Date/Time    Sodium 137 08/02/2020 01:39 AM    Potassium 3.8 08/02/2020 01:39 AM    Chloride 109 (H) 08/02/2020 01:39 AM    CO2 20 (L) 08/02/2020 01:39 AM    Anion gap 8 08/02/2020 01:39 AM    Glucose 73 08/02/2020 01:39 AM    BUN 10 08/02/2020 01:39 AM    Creatinine 1.03 08/02/2020 01:39 AM    BUN/Creatinine ratio 10 (L) 08/02/2020 01:39 AM    GFR est AA >60 08/02/2020 01:39 AM    GFR est non-AA >60 08/02/2020 01:39 AM    Calcium 8.3 (L) 08/02/2020 01:39 AM    Bilirubin, total 0.9 08/02/2020 01:39 AM    Alk. phosphatase 56 08/02/2020 01:39 AM    Protein, total 7.1 08/02/2020 01:39 AM    Albumin 3.7 08/02/2020 01:39 AM    Globulin 3.4 08/02/2020 01:39 AM     A-G Ratio 1.1 08/02/2020 01:39 AM    ALT (SGPT) 67 08/02/2020 01:39 AM    AST (SGOT) 301 (H) 08/02/2020 01:39 AM     Urinalysis and chest x-ray negative    ASSESSMENT  Hospital Problems  Never Reviewed        Codes Class Noted POA    Status epilepticus ([place]) ICD-10-CM: G40.901  ICD-9-CM: 345.3  08/01/2020 Unknown        Seizure disorder ([place]) ICD-10-CM: G40.909  ICD-9-CM: 345.90  05/02/2020 Unknown               PLAN  Admitted for breakthrough seizures, likely due to noncompliance  Patient reports that he ran out of his medications 1 to 2 days prior to admission  His baseline seizure frequency is about once every 6 months  We will maintain Keppra at 1000 mg twice daily and discontinue phenytoin  Has been receiving relatively high dose of phenytoin at 300 mg 3 times daily.  We will check phenytoin levels tomorrow a.m. to ensure they are not supratherapeutic  Continue to monitor in hospital for another 24 hours     Link Snuffer, MD

## 2020-08-02 NOTE — Progress Notes (Signed)
2000: Bedside and Verbal shift change report given to Sam, RN (oncoming nurse) by Renae Fickle, RN (offgoing nurse). Report included the following information SBAR, Kardex, Intake/Output, MAR, Recent Results, Med Rec Status, Cardiac Rhythm SR and Dual Neuro Assessment.     I agree with Michail Jewels, RN's documentation as her preceptor.     Problem: Seizure Disorder (Adult)  Goal: *STG: Remains free of seizure activity  Outcome: Progressing Towards Goal  Goal: *STG: Maintains lab values within therapeutic range  Outcome: Progressing Towards Goal  Goal: *STG/LTG: Complies with medication therapy  Outcome: Progressing Towards Goal  Goal: *STG: Remains free of injury during seizure activity  Outcome: Progressing Towards Goal  Goal: *STG: Remains safe in hospital  Outcome: Progressing Towards Goal  Goal: Interventions  Outcome: Progressing Towards Goal

## 2020-08-02 NOTE — Progress Notes (Signed)
Transition of Care Plan  RUR 13%  COVID 19 Negative   EEG     Disposition   Home   Transportation   Significant other--  Medical follow up  PCP  Specialist   [facility]      Contact  Clide Dales  [phone] (significant other)     Reason for Admission:  Status Epilepticus  Hx of seizures and PTSD and TBI, epilepsy                    RUR Score:  13%                   Plan for utilizing home health:   Not at this time       PCP: First and Last name:  None  Patient receives medical care at [facility]    Name of Practice:    Are you a current patient: Yes/No:    Approximate date of last visit:    Can you participate in a virtual visit with your PCP:                     Current Advanced Directive/Advance Care Plan: Full Code      Healthcare Decision Maker:   Click here to complete HealthCare Decision Makers including selection of the Healthcare Decision Maker Relationship (ie "Primary")         No AMD                   Transition of Care Plan:        Home with medical follow up at [facility] and [facility]     CM met with patient in his room to introduce self and explain role.   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 He receives his medications there.  He stated that he is receiving disability benefits from the [place]     Patient is living with his significant other in  an apartment He was self care and able to preform adl's and iadl's prior to admission .  He has other family in the [place] area. Patient does not drive-- his family, friends or significant other transports.     Patient does not have his insurance card-- [employer] has contacted patient and significant other to secure it.    CM will follow and assist with any transition of care needs.     Care Management Interventions  PCP Verified by CM: Yes [facility])  Mode of Transport at Discharge:   (car)  Transition of Care Consult (CM Consult): Discharge Planning  Discharge Durable Medical Equipment: No  Physical Therapy Consult: No  Occupational Therapy Consult: No  Speech Therapy Consult: No  Current Support Network: Other (lives in apartment with significant other.[place]i [place] family support   No AMD )  Discharge Location  Discharge Placement: Home

## 2020-08-02 NOTE — Progress Notes (Signed)
Progress  Notes by Dominga Ferry, MD at 08/02/20 1631                Author: Dominga Ferry, MD  Service: Internal Medicine  Author Type: Physician       Filed: 08/02/20 1636  Date of Service: 08/02/20 1631  Status: Signed          Editor: Dominga Ferry, MD (Physician)                    Hospitalist Progress Note      NAME: Samuel Rosario    DOB:  Nov 20, 1996    MRN:  [id number]          Admission HPI/Chief Complaint:   HPI (exceprt from admission H&P): "24 yo AAM with a PMH of TBI, seizures and PTSD who presented to the ED via EMS after  having seizure like activity per girlfriend. Pt is post octal and unable to participate therefore information is obtained via chart and provider. Per EMS report, pt had seizure like activity en route and was given 5 mg versed. Pt also had a witnessed  seizure in the ER and was given 10 mg ativan, keppra and fosphenytoin. Dorna Bloom also included CTH which revealed no acute process. Pt was found to be febrile and mildly elevated WBCs. Rapid covid was negative. STAT EEG was ordered and neurology was consulted.  ICU was consulted for further recommendations in management.??"   Hospital Course:   8/18   - Admitted to ICU service for status epilepticus   - Initiated on Broad spectrum abx - Merem and Vancomycin - for fever and leukocytosis   - Neurology consulted - Recommends Keppra/Fosphenytoin. Recommends against meropenem   - EEG without ongoing seizure activity   - No further seizure activity, cleared for transfer out of ICU by intensivist. Hospitalist service assumes care.     Subjective:     No overnight events. Pt feels well, at baseline.      Review of Systems:   No fever/chills, poor appetite, cough, sputum, SOB, N/V, D/C, CP, or abd pain. Tolerating PO     Objective:     VITALS:    Last 24hrs VS reviewed since prior progress note. Most recent are:   Patient Vitals for the past 24 hrs:            Temp  Pulse  Resp  BP  SpO2            08/02/20 1404  --  69  --  --  --             08/02/20 1356  98.9 ??F (37.2 ??C)  79  19  119/64  100 %     08/02/20 1209  --  71  --  --  --     08/02/20 1006  --  67  --  --  --     08/02/20 0959  98.7 ??F (37.1 ??C)  74  16  (!) 106/56  99 %     08/02/20 0600  98.4 ??F (36.9 ??C)  72  17  (!) 142/71  91 %     08/02/20 0400  --  71  --  --  --     08/02/20 0200  98.6 ??F (37 ??C)  90  22  (!) 102/55  95 %     08/01/20 2200  --  84  --  --  --     08/01/20  2154  98.8 ??F (37.1 ??C)  84  20  (!) 92/50  94 %     08/01/20 2012  --  86  --  --  --     08/01/20 1930  98.8 ??F (37.1 ??C)  81  20  97/65  100 %            08/01/20 1700  99.1 ??F (37.3 ??C)  91  24  (!) 103/57  97 %           Intake/Output Summary (Last 24 hours) at 08/02/2020 1631   Last data filed at 08/02/2020 0042     Gross per 24 hour        Intake  --        Output  300 ml        Net  -300 ml            I had a face to face encounter and independently examined this patient on 08/02/2020, as outlined below:   PHYSICAL EXAM:   General: Alert, cooperative, no acute distress     EENT:  EOMI. Anicteric sclerae. MMM   Resp:  CTA bilaterally, no wheezing or rales. No accessory muscle use   CV:  RRR, No edema   GI:  Soft, Non distended, Non tender   Neurologic:  Alert and oriented, normal speech, MAE   Psych:   Not anxious or agitated   Skin:  No rashes. No jaundice      Reviewed most current lab test results and cultures  YES   Reviewed most current radiology test results   YES   Reviewed patient's current orders and MAR    YES   PMH/SH reviewed - no change compared to H&P      Procedures: see electronic medical records for all procedures/Xrays and details which were not copied into this note but were reviewed prior to creation of Plan.        LABS:   I reviewed today's most current labs and imaging studies.   Pertinent labs include:     Recent Labs             08/02/20   0145  08/01/20   0607  08/01/20   0055     WBC  7.0  12.4*  13.1*     HGB  11.8*  12.9  12.8     HCT  38.3  40.7  41.7          PLT  213  234  245           Recent Labs                08/02/20   0145  08/02/20   0139  08/01/20   1327  08/01/20   0607  08/01/20   0055  08/01/20   0055     NA   --   137   --   132*   --   137     K   --   3.8   --   HEMOLYZED,RECOLLECT REQUESTED   --   4.3     CL   --   109*   --   106   --   107     CO2   --   20*   --   21   --   20*     GLU   --   73   --  87   --   79     BUN   --   10   --   12   --   13     CREA   --   1.03   --   1.38*   --   1.62*     CA   --   8.3*   --   8.8   --   8.8     MG  2.2   --   2.2  HEMOLYZED,RECOLLECT REQUESTED    < >  2.1     PHOS   --   2.3*  2.5*  3.0   --    --      ALB   --   3.7   --    --    --   4.4     TBILI   --   0.9   --    --    --   0.5     ALT   --   67   --    --    --   52     INR   --    --    --    --    --   1.2*        < > = values in this interval not displayed.           Care Plan discussed with: pt   ___________________________________________________________________   Assessment / Plan:   Status epilepticus   - Known history of seizure disorder secondary to TBI. Routine care at [facility]   - CT Head without acute process   - Neurology following   - EEG without ongoing seizure activity   - Seizure precautions   - Admitted for breakthrough seizures, likely due to noncompliance   - Patient reports that he ran out of his medications 1 to 2 days prior to admission   - cont Keppra at 1000 mg twice daily and discontinue phenytoin, check phenytoin levels tomorrow a.m. to ensure not supratherapeutic   Continue to monitor in hospital for another 24 hours   ??   Fever with mild leukocytosis, resolved   Unclear etiology   - UA, CXR negative for infectious source. COVID negative, BCx NGTD   - s/p Merem and Vancomycin in ICU   -cont to monitor off antibiotics - if recurrent fevers may need CSF analysis/febrile illness workup, further antibiotic coverage   ??   AKI, mild, resolved   ??   TBI, hx      18.5 - 24.9 Normal weight  / Body mass index is 23.63 kg/m??.      Code status: Full Code    Prophylaxis: SCD's   Recommended Disposition: Home w/Family      Signed: Dominga Ferry, MD   08/02/2020 4:31 PM

## 2020-08-03 LAB — CBC WITH AUTO DIFFERENTIAL
Basophils %: 0 % (ref 0–1)
Basophils Absolute: 0 10*3/uL (ref 0.0–0.1)
Eosinophils %: 0 % (ref 0–7)
Eosinophils Absolute: 0 10*3/uL (ref 0.0–0.4)
Granulocyte Absolute Count: 0 10*3/uL (ref 0.00–0.04)
Hematocrit: 38.1 % (ref 36.6–50.3)
Hemoglobin: 11.8 g/dL — ABNORMAL LOW (ref 12.1–17.0)
Immature Granulocytes: 0 % (ref 0.0–0.5)
Lymphocytes %: 27 % (ref 12–49)
Lymphocytes Absolute: 1.5 10*3/uL (ref 0.8–3.5)
MCH: 22.9 PG — ABNORMAL LOW (ref 26.0–34.0)
MCHC: 31 g/dL (ref 30.0–36.5)
MCV: 73.8 FL — ABNORMAL LOW (ref 80.0–99.0)
MPV: 10.1 FL (ref 8.9–12.9)
Monocytes %: 10 % (ref 5–13)
Monocytes Absolute: 0.5 10*3/uL (ref 0.0–1.0)
NRBC Absolute: 0 10*3/uL (ref 0.00–0.01)
Neutrophils %: 63 % (ref 32–75)
Neutrophils Absolute: 3.3 10*3/uL (ref 1.8–8.0)
Nucleated RBCs: 0 PER 100 WBC
Platelets: 213 10*3/uL (ref 150–400)
RBC: 5.16 M/uL (ref 4.10–5.70)
RDW: 14.2 % (ref 11.5–14.5)
WBC: 5.3 10*3/uL (ref 4.1–11.1)

## 2020-08-03 LAB — COMPREHENSIVE METABOLIC PANEL
ALT: 117 U/L — ABNORMAL HIGH (ref 12–78)
ALT: 153 U/L — ABNORMAL HIGH (ref 12–78)
AST: 564 U/L — ABNORMAL HIGH (ref 15–37)
AST: 666 U/L — ABNORMAL HIGH (ref 15–37)
Albumin/Globulin Ratio: 1 — ABNORMAL LOW (ref 1.1–2.2)
Albumin/Globulin Ratio: 1 — ABNORMAL LOW (ref 1.1–2.2)
Albumin: 3.5 g/dL (ref 3.5–5.0)
Albumin: 3.9 g/dL (ref 3.5–5.0)
Alkaline Phosphatase: 55 U/L (ref 45–117)
Alkaline Phosphatase: 63 U/L (ref 45–117)
Anion Gap: 5 mmol/L (ref 5–15)
Anion Gap: 5 mmol/L (ref 5–15)
BUN: 6 MG/DL (ref 6–20)
BUN: 5 MG/DL — ABNORMAL LOW (ref 6–20)
Bun/Cre Ratio: 7 — ABNORMAL LOW (ref 12–20)
Bun/Cre Ratio: 5 — ABNORMAL LOW (ref 12–20)
CO2: 26 mmol/L (ref 21–32)
CO2: 30 mmol/L (ref 21–32)
Calcium: 8.4 MG/DL — ABNORMAL LOW (ref 8.5–10.1)
Calcium: 9.2 MG/DL (ref 8.5–10.1)
Chloride: 107 mmol/L (ref 97–108)
Chloride: 104 mmol/L (ref 97–108)
Creatinine: 0.92 MG/DL (ref 0.70–1.30)
Creatinine: 0.92 MG/DL (ref 0.70–1.30)
EGFR IF NonAfrican American: >60 mL/min/{1.73_m2} (ref >60)
EGFR IF NonAfrican American: >60 mL/min/{1.73_m2} (ref >60)
GFR African American: >60 mL/min/{1.73_m2} (ref >60)
GFR African American: >60 mL/min/{1.73_m2} (ref >60)
Globulin: 3.5 g/dL (ref 2.0–4.0)
Globulin: 4 g/dL (ref 2.0–4.0)
Glucose: 107 mg/dL — ABNORMAL HIGH (ref 65–100)
Glucose: 126 mg/dL — ABNORMAL HIGH (ref 65–100)
Potassium: 3.3 mmol/L — ABNORMAL LOW (ref 3.5–5.1)
Potassium: 3.3 mmol/L — ABNORMAL LOW (ref 3.5–5.1)
Sodium: 138 mmol/L (ref 136–145)
Sodium: 139 mmol/L (ref 136–145)
Total Bilirubin: 0.7 MG/DL (ref 0.2–1.0)
Total Bilirubin: 0.5 MG/DL (ref 0.2–1.0)
Total Protein: 7 g/dL (ref 6.4–8.2)
Total Protein: 7.9 g/dL (ref 6.4–8.2)

## 2020-08-03 LAB — POCT GLUCOSE
POC Glucose: 77 mg/dL (ref 65–117)
POC Glucose: 117 mg/dL (ref 65–117)
POC Glucose: 115 mg/dL (ref 65–117)

## 2020-08-03 LAB — LEVETIRACETAM (KEPPRA)
KEPPRA,KEPP: 14.2 ug/mL (ref 10.0–40.0)
Levetiracetam (Keppra): 14.2 ug/mL (ref 10.0–40.0)

## 2020-08-03 LAB — PHENYTOIN
Phenytoin: 25.7 ug/mL (ref 10.0–20.0)
Phenytoin: 25.7 ug/mL — CR (ref 10.0–20.0)

## 2020-08-03 LAB — PHOSPHORUS
Phosphorus: 2.3 MG/DL — ABNORMAL LOW (ref 2.6–4.7)
Phosphorus: 2.3 MG/DL — ABNORMAL LOW (ref 2.6–4.7)

## 2020-08-03 LAB — C-REACTIVE PROTEIN: CRP: 2.9 mg/dL — ABNORMAL HIGH (ref 0.00–0.60)

## 2020-08-03 LAB — CBC WITH AUTOMATED DIFF
ABS. BASOPHILS: 0 10*3/uL (ref 0.0–0.1)
ABS. EOSINOPHILS: 0 10*3/uL (ref 0.0–0.4)
ABS. IMM. GRANS.: 0 10*3/uL (ref 0.00–0.04)
ABS. LYMPHOCYTES: 1.5 10*3/uL (ref 0.8–3.5)
ABS. MONOCYTES: 0.5 10*3/uL (ref 0.0–1.0)
ABS. NEUTROPHILS: 3.3 10*3/uL (ref 1.8–8.0)
ABSOLUTE NRBC: 0 10*3/uL (ref 0.00–0.01)
BASOPHILS: 0 % (ref 0–1)
EOSINOPHILS: 0 % (ref 0–7)
HCT: 38.1 % (ref 36.6–50.3)
HGB: 11.8 g/dL — ABNORMAL LOW (ref 12.1–17.0)
IMMATURE GRANULOCYTES: 0 % (ref 0.0–0.5)
LYMPHOCYTES: 27 % (ref 12–49)
MCH: 22.9 PG — ABNORMAL LOW (ref 26.0–34.0)
MCHC: 31 g/dL (ref 30.0–36.5)
MCV: 73.8 FL — ABNORMAL LOW (ref 80.0–99.0)
MONOCYTES: 10 % (ref 5–13)
MPV: 10.1 FL (ref 8.9–12.9)
NEUTROPHILS: 63 % (ref 32–75)
NRBC: 0 PER 100 WBC
PLATELET: 213 10*3/uL (ref 150–400)
RBC: 5.16 M/uL (ref 4.10–5.70)
RDW: 14.2 % (ref 11.5–14.5)
WBC: 5.3 10*3/uL (ref 4.1–11.1)

## 2020-08-03 LAB — GLUCOSE, POC
Glucose (POC): 77 mg/dL (ref 65–117)
Glucose (POC): 117 mg/dL (ref 65–117)
Glucose (POC): 115 mg/dL (ref 65–117)

## 2020-08-03 LAB — METABOLIC PANEL, COMPREHENSIVE
A-G Ratio: 1 — ABNORMAL LOW (ref 1.1–2.2)
A-G Ratio: 1 — ABNORMAL LOW (ref 1.1–2.2)
ALT (SGPT): 117 U/L — ABNORMAL HIGH (ref 12–78)
ALT (SGPT): 153 U/L — ABNORMAL HIGH (ref 12–78)
AST (SGOT): 564 U/L — ABNORMAL HIGH (ref 15–37)
AST (SGOT): 666 U/L — ABNORMAL HIGH (ref 15–37)
Albumin: 3.5 g/dL (ref 3.5–5.0)
Albumin: 3.9 g/dL (ref 3.5–5.0)
Alk. phosphatase: 55 U/L (ref 45–117)
Alk. phosphatase: 63 U/L (ref 45–117)
Anion gap: 5 mmol/L (ref 5–15)
Anion gap: 5 mmol/L (ref 5–15)
BUN/Creatinine ratio: 7 — ABNORMAL LOW (ref 12–20)
BUN/Creatinine ratio: 5 — ABNORMAL LOW (ref 12–20)
BUN: 6 MG/DL (ref 6–20)
BUN: 5 MG/DL — ABNORMAL LOW (ref 6–20)
Bilirubin, total: 0.7 MG/DL (ref 0.2–1.0)
Bilirubin, total: 0.5 MG/DL (ref 0.2–1.0)
CO2: 26 mmol/L (ref 21–32)
CO2: 30 mmol/L (ref 21–32)
Calcium: 8.4 MG/DL — ABNORMAL LOW (ref 8.5–10.1)
Calcium: 9.2 MG/DL (ref 8.5–10.1)
Chloride: 107 mmol/L (ref 97–108)
Chloride: 104 mmol/L (ref 97–108)
Creatinine: 0.92 MG/DL (ref 0.70–1.30)
Creatinine: 0.92 MG/DL (ref 0.70–1.30)
GFR est AA: >60 mL/min/{1.73_m2} (ref >60)
GFR est AA: >60 mL/min/{1.73_m2} (ref >60)
GFR est non-AA: >60 mL/min/{1.73_m2} (ref >60)
GFR est non-AA: >60 mL/min/{1.73_m2} (ref >60)
Globulin: 3.5 g/dL (ref 2.0–4.0)
Globulin: 4 g/dL (ref 2.0–4.0)
Glucose: 107 mg/dL — ABNORMAL HIGH (ref 65–100)
Glucose: 126 mg/dL — ABNORMAL HIGH (ref 65–100)
Potassium: 3.3 mmol/L — ABNORMAL LOW (ref 3.5–5.1)
Potassium: 3.3 mmol/L — ABNORMAL LOW (ref 3.5–5.1)
Protein, total: 7 g/dL (ref 6.4–8.2)
Protein, total: 7.9 g/dL (ref 6.4–8.2)
Sodium: 138 mmol/L (ref 136–145)
Sodium: 139 mmol/L (ref 136–145)

## 2020-08-03 LAB — C REACTIVE PROTEIN, QT: C-Reactive protein: 2.9 mg/dL — ABNORMAL HIGH (ref 0.00–0.60)

## 2020-08-03 MED ORDER — BRIVARACETAM 50 MG TABLET
50 mg | Freq: Two times a day (BID) | ORAL | Status: DC
Start: 2020-08-03 — End: 2020-08-03

## 2020-08-03 MED ORDER — POTASSIUM CHLORIDE SR 10 MEQ TAB
10 mEq | Freq: Once | ORAL | Status: AC
Start: 2020-08-03 — End: 2020-08-03
  Administered 2020-08-03: 19:00:00 via ORAL

## 2020-08-03 MED ORDER — BRIVARACETAM 50 MG TABLET
50 mg | ORAL_TABLET | Freq: Two times a day (BID) | ORAL | 1 refills | Status: AC
Start: 2020-08-03 — End: ?

## 2020-08-03 MED FILL — PHOS-NAK 280 MG-160 MG-250 MG ORAL POWDER PACKET: 280-160-250 mg | ORAL | Qty: 1

## 2020-08-03 MED FILL — LEVETIRACETAM 500 MG/5 ML IV SOLN: 500 mg/5 mL | INTRAVENOUS | Qty: 10

## 2020-08-03 MED FILL — K-TAB 10 MEQ TABLET,EXTENDED RELEASE: 10 mEq | ORAL | Qty: 4

## 2020-08-03 NOTE — Discharge Summary (Signed)
Discharge Summary by Dominga Ferry, MD at 08/03/20 1716                Author: Dominga Ferry, MD  Service: Internal Medicine  Author Type: Physician       Filed: 08/04/20 1428  Date of Service: 08/03/20 1716  Status: Addendum          Editor: Dominga Ferry, MD (Physician)          Related Notes: Original Note by Dominga Ferry, MD (Physician) filed at 08/03/20 1730                       Discharge Summary        PATIENT ID: Samuel Rosario   MRN: [id number]    DATE OF BIRTH: June 14, 1996     DATE OF ADMISSION: 08/01/2020 12:07 AM     DATE OF DISCHARGE: 08/03/2020  Pt left AMA   PRIMARY CARE PROVIDER: None    DISCHARGING PROVIDER: Dominga Ferry, MD      To contact this individual call [phone] and ask the operator to page.  If unavailable ask to be transferred the Adult Hospitalist Department.      CONSULTATIONS: IP CONSULT TO NEUROLOGY      PROCEDURES/SURGERIES: * No surgery found *      ADMITTING DIAGNOSES & HOSPITAL COURSE:    HPI (exceprt from admission H&P): "24 yo AAM with a PMH of TBI, seizures and PTSD who presented to the ED via EMS after  having seizure like activity per girlfriend. Pt is post octal and unable to participate therefore information is obtained via chart and provider. Per EMS report, pt had seizure like activity en route and was given 5 mg versed. Pt also had a witnessed  seizure in the ER and was given 10 mg ativan, keppra and fosphenytoin. Dorna Bloom also included CTH which revealed no acute process. Pt was found to be febrile and mildly elevated WBCs. Rapid covid was negative. STAT EEG was ordered and neurology was consulted.  ICU was consulted for further recommendations in management.??"   Hospital Course:   8/18   - Admitted to ICU service for status epilepticus   - Initiated on Broad spectrum abx - Merem and Vancomycin - for fever and leukocytosis   - Neurology consulted - Recommends Keppra/Fosphenytoin. Recommends against meropenem   - EEG without ongoing seizure activity   - No further  seizure activity, cleared for transfer out of ICU by intensivist. Hospitalist service assumes care.        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 Routine care at [facility]   - CT Head without acute process   - Neurology following   - EEG without ongoing seizure activity   - Admitted for breakthrough seizures, likely due to noncompliance   - Patient reports that he ran??out of his medications 1 to 2 days prior to admission   Restarted home Briviact 50 Mg twice daily, discontinue Keppra, Phenytoin has been discontinued and his level was supratherapeutic this morning. Also elevation of Liver enzymes. No signs of toxicity noted. Neurology discussed that it will likely take  2 to 3 days for phenytoin to completely clear from his system and LFTS to improve.      Elevated LFTs likely DILI d/t (supratherapeutic) phenytoin   Pt left AMA before could discuss  I called pt on 8/20 after he left re: need for repeat CMP in 1-3 days but no answer      Addendum: the following day 8/21 I called both his and his wife's phones again no answer      Fever with mild leukocytosis, resolved   Unclear etiology   - UA, CXR negative for infectious source. COVID negative, BCx NGTD   - s/p Merem and Vancomycin in ICU   ??   AKI, mild, resolved   ??   TBI, hx      Hypokalemia, repleted   Recheck as outpt           ADDITIONAL CARE RECOMMENDATIONS: f/u PCP, neuro      PENDING TEST RESULTS:    At the time of discharge the following test results are still pending: none       Patient Instructions       FOLLOW UP APPOINTMENTS:       Follow-up Information               Follow up With  Specialties  Details  Why  Contact Info              None        None ([phone]) Patient stated that they have no PCP                 DIET: Regular Diet      ACTIVITY: Activity as tolerated      NOTIFY YOUR PHYSICIAN FOR ANY OF THE FOLLOWING:    Fever over 101 degrees for 24 hours.    Chest pain, shortness of  breath, fever, chills, nausea, vomiting, diarrhea, change in mentation, falling, weakness, bleeding. Severe pain or pain not relieved by medications.   Or, any other signs or symptoms that you may have questions about.      DISPOSITION:       x  Home With:     OT    PT    HH    RN                   Long term SNF/Inpatient Rehab       Independent/assisted living          Hospice          Other:           PATIENT CONDITION AT DISCHARGE:    Functional status         Poor        Deconditioned         x  Independent         Cognition       x   Lucid        Forgetful           Dementia         Catheters/lines (plus indication)         Foley        PICC        PEG         x  None         Code status       x   Full code           DNR         PHYSICAL EXAMINATION AT DISCHARGE:   General:  Alert, cooperative, no distress   Back:    Symmetric, ROM normal. No CVA tenderness.   Lungs:  Clear to auscultation bilaterally, symmetric expansion, no respiratory distress   Chest wall:  No tenderness or deformity   Heart:   Regular rate and rhythm, no murmur   Abdomen:   Soft, non-tender   Extremities: Extremities normal, atraumatic, no cyanosis or edema   Skin:  Skin color, texture, turgor normal. No rashes or lesions   Neurologic: CNII-XII intact. MAE. A&O      CHRONIC MEDICAL DIAGNOSES:      Problem List  as of 08/03/2020  Never Reviewed                    Codes  Class  Noted - Resolved             Status epilepticus (HCC)  ICD-10-CM: G40.901   ICD-9-CM: 345.3    08/01/2020 - Present                       Seizure disorder (HCC)  ICD-10-CM: G40.909   ICD-9-CM: 345.90    05/02/2020 - Present                          DISCHARGE MEDICATIONS:            My Medications              START taking these medications                 Instructions  Each Dose to Equal  Morning  Noon  Evening  Bedtime             brivaracetam 50 mg tablet   Commonly known as: BRIVIACT      Your last dose was:       Your next dose is:             Take 1 Tablet by mouth  two (2) times a day. Max Daily Amount: 100 mg.     50 mg                                          Where to Get Your Medications               These medications were sent to [facility] - [place], [place] - [address]    [address] [place], [place] [address]          Phone: [phone]     ??  brivaracetam 50 mg tablet                Greater than 30 minutes were spent with the patient on counseling and coordination of care      Signed:    Dominga Ferry, MD   08/03/2020   5:22 PM

## 2020-08-03 NOTE — Progress Notes (Signed)
Primary RN notified that patient not on telemetry and not in room or bathroom. Patient has been anxious all day and previous day to leave hospital. Per CMU, last time patient on telemetry was approximately 1641. Patient left hospital against medical advice. MD aware.

## 2020-08-03 NOTE — Progress Notes (Signed)
Critical phenytoin level of 25.7 called by lab Ashley Royalty) at 0501. Relayed to Rosalio Macadamia, NP. No new orders given.

## 2020-08-03 NOTE — Progress Notes (Signed)
Bedside shift change report given to Renae Fickle RN (oncoming nurse) by Darla Lesches (offgoing nurse). Report included the following information SBAR, ED Summary, Intake/Output, Recent Results, Med Rec Status and Cardiac Rhythm NSR.

## 2020-08-03 NOTE — Progress Notes (Signed)
SUBJECTIVE  Samuel Rosario is a 24 y.o. male  with a known history of epilepsy, traumatic brain injury and post-traumatic stress disorder.  There is a concern that he may not be compliant with his medications.  He is admitted after multiple seizures and received multiple doses of Versed and Ativan.    He was loaded with Keppra and fosphenytoin.   Has not had any further seizures.      His phenytoin level was supratherapeutic this morning     EEG did not show any ongoing seizure activity but shows epileptiform potential in the left temporal region.    He tells me today that he recently started taking new medication Briviact 50 mg twice daily and was doing quite well on this medication and did not have any further seizures.  Seizures recurred after he missed 2-3 doses.       EXAM  Exam:  Visit Vitals  BP 128/72 (BP 1 Location: Right arm, BP Patient Position: Sitting)   Pulse 76   Temp 98.5 ??F (36.9 ??C)   Resp 23   Ht '5\' 9"'$  (1.753 m)   Wt 160 lb (72.6 kg)   SpO2 100%   BMI 23.63 kg/m??     Gen:  CV: RRR  Lungs: non labored breathing  Abd: non distending  Neuro: A&O x 3, no dysarthria or aphasia  CN II-XII: PERRL, EOMI, face symmetric, tongue/palate midline  Motor: strength 5/5 all four ext  Sensory: intact to LT  Gait: Deferred    LABS/ IMAGING  Lab Results   Component Value Date/Time    WBC 5.3 08/03/2020 03:36 AM    HGB 11.8 (L) 08/03/2020 03:36 AM    HCT 38.1 08/03/2020 03:36 AM    PLATELET 213 08/03/2020 03:36 AM    MCV 73.8 (L) 08/03/2020 03:36 AM     Lab Results   Component Value Date/Time    Sodium 138 08/03/2020 03:36 AM    Potassium 3.3 (L) 08/03/2020 03:36 AM    Chloride 107 08/03/2020 03:36 AM    CO2 26 08/03/2020 03:36 AM    Anion gap 5 08/03/2020 03:36 AM    Glucose 107 (H) 08/03/2020 03:36 AM    BUN 6 08/03/2020 03:36 AM    Creatinine 0.92 08/03/2020 03:36 AM    BUN/Creatinine ratio 7 (L) 08/03/2020 03:36 AM    GFR est AA >60 08/03/2020 03:36 AM    GFR est non-AA >60 08/03/2020 03:36 AM    Calcium 8.4 (L)  08/03/2020 03:36 AM    Bilirubin, total 0.7 08/03/2020 03:36 AM    Alk. phosphatase 55 08/03/2020 03:36 AM    Protein, total 7.0 08/03/2020 03:36 AM    Albumin 3.5 08/03/2020 03:36 AM    Globulin 3.5 08/03/2020 03:36 AM    A-G Ratio 1.0 (L) 08/03/2020 03:36 AM    ALT (SGPT) 117 (H) 08/03/2020 03:36 AM    AST (SGOT) 564 (H) 08/03/2020 03:36 AM     Urinalysis and chest x-ray negative    Lab Results   Component Value Date/Time    Phenytoin 25.7 (HH) 08/03/2020 03:36 AM       ASSESSMENT  Hospital Problems  Never Reviewed        Codes Class Noted POA    Status epilepticus ([place]) ICD-10-CM: G40.901  ICD-9-CM: 345.3  08/01/2020 Unknown        Seizure disorder ([place]) ICD-10-CM: G40.909  ICD-9-CM: 345.90  05/02/2020 Unknown  PLAN  Admitted for breakthrough seizures, likely because he ran out of his medication   Recommend restarting Briviact 50 Mg twice daily and discontinue Keppra  Phenytoin has been discontinued and his level was supratherapeutic this morning. Also elevation of Liver enzymes. No signs of toxicity noted  We discussed that it will likely take 2 to 3 days for phenytoin to completely clear from his system and LFTS to improve    He will need to be extra careful and take all seizure precautions.  States that his girlfriend will be home with him and he wants to go home.   May discharge and continue outpatient follow-up at [facility] in the next few days for labwork      Link Snuffer, MD

## 2020-08-06 LAB — CULTURE, BLOOD, PAIRED
Culture result:: NO GROWTH
Culture: NO GROWTH

## 2021-09-20 ENCOUNTER — Encounter (HOSPITAL_COMMUNITY): Payer: Self-pay

## 2021-09-20 ENCOUNTER — Emergency Department (HOSPITAL_COMMUNITY)
Admission: EM | Admit: 2021-09-20 | Discharge: 2021-09-20 | Disposition: A | Discharge status: Home / Self Care | Payer: No Typology Code available for payment source | Attending: Student | Admitting: Student

## 2021-09-20 ENCOUNTER — Emergency Department (HOSPITAL_COMMUNITY): Payer: No Typology Code available for payment source

## 2021-09-20 DIAGNOSIS — R519 Headache, unspecified: Secondary | ICD-10-CM | POA: Insufficient documentation

## 2021-09-20 DIAGNOSIS — R569 Unspecified convulsions: Secondary | ICD-10-CM | POA: Diagnosis present

## 2021-09-20 DIAGNOSIS — M542 Cervicalgia: Secondary | ICD-10-CM | POA: Diagnosis not present

## 2021-09-20 HISTORY — DX: Unspecified convulsions: R56.9

## 2021-09-20 LAB — URINALYSIS, ROUTINE W REFLEX MICROSCOPIC
Bacteria, UA: NONE SEEN
Bilirubin Urine: NEGATIVE
Glucose, UA: NEGATIVE mg/dL
Hgb urine dipstick: NEGATIVE
Ketones, ur: 20 mg/dL — AB
Leukocytes,Ua: NEGATIVE
Nitrite: NEGATIVE
Protein, ur: 100 mg/dL — AB
Specific Gravity, Urine: 1.025 (ref 1.005–1.030)
pH: 5 (ref 5.0–8.0)

## 2021-09-20 LAB — CBC WITH DIFFERENTIAL/PLATELET
Abs Immature Granulocytes: 0.04 10*3/uL (ref 0.00–0.07)
Basophils Absolute: 0 10*3/uL (ref 0.0–0.1)
Basophils Relative: 0 %
Eosinophils Absolute: 0 10*3/uL (ref 0.0–0.5)
Eosinophils Relative: 0 %
HCT: 38.6 % — ABNORMAL LOW (ref 39.0–52.0)
Hemoglobin: 12.3 g/dL — ABNORMAL LOW (ref 13.0–17.0)
Immature Granulocytes: 1 %
Lymphocytes Relative: 27 %
Lymphs Abs: 2 10*3/uL (ref 0.7–4.0)
MCH: 23.6 pg — ABNORMAL LOW (ref 26.0–34.0)
MCHC: 31.9 g/dL (ref 30.0–36.0)
MCV: 73.9 fL — ABNORMAL LOW (ref 80.0–100.0)
Monocytes Absolute: 0.6 10*3/uL (ref 0.1–1.0)
Monocytes Relative: 8 %
Neutro Abs: 4.8 10*3/uL (ref 1.7–7.7)
Neutrophils Relative %: 64 %
Platelets: 200 10*3/uL (ref 150–400)
RBC: 5.22 MIL/uL (ref 4.22–5.81)
RDW: 16.6 % — ABNORMAL HIGH (ref 11.5–15.5)
WBC: 7.5 10*3/uL (ref 4.0–10.5)
nRBC: 0 % (ref 0.0–0.2)

## 2021-09-20 LAB — VALPROIC ACID LEVEL: Valproic Acid Lvl: 72 ug/mL (ref 50.0–100.0)

## 2021-09-20 LAB — COMPREHENSIVE METABOLIC PANEL
ALT: 72 U/L — ABNORMAL HIGH (ref 0–44)
AST: 55 U/L — ABNORMAL HIGH (ref 15–41)
Albumin: 3.9 g/dL (ref 3.5–5.0)
Alkaline Phosphatase: 45 U/L (ref 38–126)
Anion gap: 10 (ref 5–15)
BUN: 10 mg/dL (ref 6–20)
CO2: 24 mmol/L (ref 22–32)
Calcium: 8.8 mg/dL — ABNORMAL LOW (ref 8.9–10.3)
Chloride: 102 mmol/L (ref 98–111)
Creatinine, Ser: 0.94 mg/dL (ref 0.61–1.24)
GFR, Estimated: >60 mL/min (ref >60)
Glucose, Bld: 109 mg/dL — ABNORMAL HIGH (ref 70–99)
Potassium: 3.3 mmol/L — ABNORMAL LOW (ref 3.5–5.1)
Sodium: 136 mmol/L (ref 135–145)
Total Bilirubin: 0.5 mg/dL (ref 0.3–1.2)
Total Protein: 7.3 g/dL (ref 6.5–8.1)

## 2021-09-20 LAB — RAPID URINE DRUG SCREEN, HOSP PERFORMED
Amphetamines: NOT DETECTED
Barbiturates: NOT DETECTED
Benzodiazepines: NOT DETECTED
Cocaine: NOT DETECTED
Opiates: NOT DETECTED
Tetrahydrocannabinol: POSITIVE — AB
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
## 2021-09-20 NOTE — ED Notes (Addendum)
RN went back to check on patient, patient was no longer in his bed. Riley Manis PA notified. Patient seen by other staff members walking out of department.

## 2021-09-20 NOTE — ED Notes (Signed)
PT is aware that a urine sample is needed. 

## 2021-09-20 NOTE — ED Notes (Signed)
Pt and visitor leaving. They state that they are not waiting any longer.
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
## 2021-09-20 NOTE — ED Notes (Signed)
Patient took out his own IV and was about to get up and walk out. RN told patient that we are waiting on Neuro consult. RN notified Ruben Gottron, PA. PA came to talk with patient about the risks of leaving.

## 2021-09-21 NOTE — Discharge Instructions (Signed)
SEIZURE PRECAUTIONS °Per [place] DMV statutes, patients with seizures are not allowed to drive until they have been seizure-free for six months. °  °Use caution when using heavy equipment or power tools. Avoid working on ladders or at heights. Take showers instead of baths. Ensure the water temperature is not too high on the home water heater. Do not go swimming alone. Do not lock yourself in a room alone (i.e. bathroom). When caring for infants or small children, sit down when holding, feeding, or changing them to minimize risk of injury to the child in the event you have a seizure. Maintain good sleep hygiene. Avoid alcohol. °  °If patient has another seizure, call 911 and bring them back to the ED if: °A.  The seizure lasts longer than 5 minutes.      °B.  The patient doesn't wake shortly after the seizure or has new problems such as difficulty seeing, speaking or moving following the seizure °C.  The patient was injured during the seizure °D.  The patient has a temperature over 102 F (39C) °E.  The patient vomited during the seizure and now is having trouble breathing ° ° °

## 2022-01-09 ENCOUNTER — Encounter (HOSPITAL_COMMUNITY): Payer: Self-pay

## 2022-01-09 ENCOUNTER — Emergency Department (HOSPITAL_COMMUNITY)
Admission: EM | Admit: 2022-01-09 | Discharge: 2022-01-09 | Disposition: A | Discharge status: Home / Self Care | Payer: No Typology Code available for payment source | Attending: Emergency Medicine | Admitting: Emergency Medicine

## 2022-01-09 ENCOUNTER — Other Ambulatory Visit: Payer: Self-pay

## 2022-01-09 DIAGNOSIS — X58XXXA Exposure to other specified factors, initial encounter: Secondary | ICD-10-CM | POA: Diagnosis not present

## 2022-01-09 DIAGNOSIS — S00532A Contusion of oral cavity, initial encounter: Secondary | ICD-10-CM | POA: Insufficient documentation

## 2022-01-09 DIAGNOSIS — G40909 Epilepsy, unspecified, not intractable, without status epilepticus: Secondary | ICD-10-CM | POA: Insufficient documentation

## 2022-01-09 DIAGNOSIS — S00502A Unspecified superficial injury of oral cavity, initial encounter: Secondary | ICD-10-CM | POA: Diagnosis present

## 2022-01-09 LAB — CBC
HCT: 40.6 % (ref 39.0–52.0)
Hemoglobin: 13.1 g/dL (ref 13.0–17.0)
MCH: 23.8 pg — ABNORMAL LOW (ref 26.0–34.0)
MCHC: 32.3 g/dL (ref 30.0–36.0)
MCV: 73.8 fL — ABNORMAL LOW (ref 80.0–100.0)
Platelets: 167 10*3/uL (ref 150–400)
RBC: 5.5 MIL/uL (ref 4.22–5.81)
RDW: 15.3 % (ref 11.5–15.5)
WBC: 9.6 10*3/uL (ref 4.0–10.5)
nRBC: 0 % (ref 0.0–0.2)

## 2022-01-09 LAB — BASIC METABOLIC PANEL
Anion gap: 9 (ref 5–15)
BUN: 8 mg/dL (ref 6–20)
CO2: 24 mmol/L (ref 22–32)
Calcium: 8.9 mg/dL (ref 8.9–10.3)
Chloride: 103 mmol/L (ref 98–111)
Creatinine, Ser: 1.17 mg/dL (ref 0.61–1.24)
GFR, Estimated: >60 mL/min (ref >60)
Glucose, Bld: 103 mg/dL — ABNORMAL HIGH (ref 70–99)
Potassium: 3.7 mmol/L (ref 3.5–5.1)
Sodium: 136 mmol/L (ref 135–145)

## 2022-01-09 LAB — VALPROIC ACID LEVEL: Valproic Acid Lvl: 72 ug/mL (ref 50.0–100.0)

## 2022-01-09 MED ORDER — SODIUM CHLORIDE 0.9 % IV SOLN
200.0000 mg | Freq: Once | INTRAVENOUS | Status: AC
  Administered 2022-01-09: 200 mg via INTRAVENOUS
  Filled 2022-01-09: qty 20

## 2022-01-09 MED ORDER — LACOSAMIDE 50 MG PO TABS: 50.0000 mg | ORAL_TABLET | Freq: Two times a day (BID) | ORAL | 0 refills | Status: AC

## 2022-01-09 NOTE — ED Notes (Signed)
Patient able to ambulate to bathroom and back without assitance. Gait steady.

## 2022-01-09 NOTE — Discharge Instructions (Addendum)
You have been evaluated for your seizure activities.  Please continue to take Depakote as previously prescribed but also take Vimpat prescribed today.  Follow-up closely with your neurologist for further evaluation and managements of your seizure activities.  Please be aware that this medication may make you dizzy or drowsy and sometimes can blurry vision.  Consuming alcohol or marijuana will increase the side effect.  Return to the ER if you have any concern.  Avoid driving or operating heavy machinery unless you are cleared by your neurologist

## 2022-01-09 NOTE — ED Provider Notes (Signed)
[facility]-EMERGENCY DEPT Provider Note   CSN: [id number] Arrival date & time: 01/09/22  1639     History  Chief Complaint  Patient presents with   Seizures    Riley Francis is a 26 y.o. male.  The history is provided by the patient, medical records and a significant other. No language interpreter was used.  Seizures  26 year old male known history of seizure brought here via EMS from home for evaluation witnessed seizure.  History obtained through girlfriend who is at bedside.  Patient is currently on Depakote for his seizure.  He has known history of seizure throughout his life.  He normally takes Depakote 500 mg twice daily.  Report he has been compliant with his medication.  Today while having conversation with his girlfriend he developed multiple episodes of tonic clonic seizure each lasting less than a minute but happened consecutively at least 4 episodes per girlfriend.  When EMS arrived, seizures ceased.  Patient brought here for further assessment.  At this time he does endorse generalized fatigue and some body aches.  Did admits to biting his tongue.  Denies urinating himself.  He denies any recent sickness.  Denies any recent drug use or change in stress level last seizure episode was 2 months ago when he was seen in the ED but left AMA  Home Medications Prior to Admission medications   Not on File      Allergies    Patient has no known allergies.    Review of Systems   Review of Systems  Neurological:  Positive for seizures.  All other systems reviewed and are negative.  Physical Exam Updated Vital Signs BP 106/83    Pulse 82    Temp 98.7 F (37.1 C) (Oral)    Resp 16    SpO2 100%  Physical Exam Vitals and nursing note reviewed.  Constitutional:      General: He is not in acute distress.    Appearance: He is well-developed.  HENT:     Head: Atraumatic.     Mouth/Throat:     Comments: Mouth exam: Small bruising noted to the corner of the  tongue without any deep laceration. Eyes:     Extraocular Movements: Extraocular movements intact.     Conjunctiva/sclera: Conjunctivae normal.     Pupils: Pupils are equal, round, and reactive to light.  Cardiovascular:     Rate and Rhythm: Normal rate and regular rhythm.     Pulses: Normal pulses.     Heart sounds: Normal heart sounds.  Pulmonary:     Effort: Pulmonary effort is normal.     Breath sounds: Normal breath sounds.  Abdominal:     Palpations: Abdomen is soft.     Tenderness: There is no abdominal tenderness.  Musculoskeletal:     Cervical back: Neck supple.  Skin:    Findings: No rash.  Neurological:     Mental Status: He is alert and oriented to person, place, and time.     GCS: GCS eye subscore is 4. GCS verbal subscore is 5. GCS motor subscore is 6.     Cranial Nerves: Cranial nerves 2-12 are intact.     Sensory: Sensation is intact.     Motor: Motor function is intact.  Psychiatric:        Mood and Affect: Mood normal.    ED Results / Procedures / Treatments   Labs (all labs ordered are listed, but only abnormal results are displayed) Labs Reviewed  CBC -  Abnormal; Notable for the following components:      Result Value   MCV 73.8 (*)    MCH 23.8 (*)    All other components within normal limits  BASIC METABOLIC PANEL - Abnormal; Notable for the following components:   Glucose, Bld 103 (*)    All other components within normal limits  VALPROIC ACID LEVEL  CBG MONITORING, ED    EKG EKG Interpretation  Date/Time:  Thursday January 09 2022 18:09:59 EST Ventricular Rate:  77 PR Interval:  204 QRS Duration: 90 QT Interval:  341 QTC Calculation: 386 R Axis:   -77 Text Interpretation: Sinus rhythm Borderline prolonged PR interval Left anterior fascicular block ST elev, probable normal early repol pattern Confirmed by Vanetta MuldersZackowski, Scott [phone]([id number]) on 01/09/2022 6:39:21 PM  Radiology No results found.  Procedures Procedures    Medications Ordered in  ED Medications  lacosamide (VIMPAT) 200 mg in sodium chloride 0.9 % 25 mL IVPB (0 mg Intravenous Stopped 01/09/22 2021)    ED Course/ Medical Decision Making/ A&P                           Medical Decision Making Problems Addressed: Seizure disorder [facility](HCC): chronic illness or injury with exacerbation, progression, or side effects of treatment  Amount and/or Complexity of Data Reviewed Independent Historian: friend    Details: significant other give history External Data Reviewed: labs, radiology and ECG. Labs: ordered. Decision-making details documented in ED Course. Radiology: ordered and independent interpretation performed. Decision-making details documented in ED Course. ECG/medicine tests: ordered. Decision-making details documented in ED Course.  Risk Prescription drug management. Decision regarding hospitalization.   BP 106/83    Pulse 82    Temp 98.7 F (37.1 C) (Oral)    Resp 16    SpO2 100%   5:19 PM This is a 26 year old male with known history of seizure currently on Depakote who had a series of witnessed seizure by his girlfriend earlier today.  At this time he is mentating appropriately.  He was postictal when EMS first seen.  He report he has been compliant with his medication.  He denies any provocation of his seizure.  We will check Depakote level, basic labs, and will consult neurology for recommendation.  Patient has been on Depakote for the past year.  I have reviewed patient's prior notes from from ER visit on 09/20/2021.  He was seen for seizures but left AMA.  5:54 PM Appreciate consultation from on call neurologist Dr. Jerrell BelfastAurora.  Since pt's Depakote level is therapeutic and he has taken Keppra previously, he recommend starting Vimpat 200mg  IV and pt can go home with Vimpat 50mg  BID and outpt f/u with neurology.   Labs and EKG was independently reviewed and interpreted by me.  Labs are reassuring, EKG shows sinus rhythm with borderline prolonged PR interval.  No  evidence of sick sinus syndrome, irregular heartbeat or AV block noted.  EKG was obtained to assess for compatibility with Vimpat.  9:18 PM Patient has been monitored for the past 5 hours.  At this time he is back to his baseline, ambulating without difficulty, answering questions appropriately.  He agrees to start taking Vimpat on top of his Depakote for better management of his seizure.  He agrees to follow-up outpatient with his neurologist for further care.  Return precaution given.  I have consider admission due to his recurrent seizures however with patient being monitored for the past 5 hours and have  been doing well, felt patient is stable to go home with his significant other who was available at bedside.        Final Clinical Impression(s) / ED Diagnoses Final diagnoses:  Seizure disorder [facility])    Rx / DC Orders ED Discharge Orders          Ordered    lacosamide (VIMPAT) 50 MG TABS tablet  2 times daily        01/09/22 2124              Fayrene Helper, PA-C 01/09/22 2127    Vanetta Mulders, MD 01/12/22 1650

## 2022-01-09 NOTE — ED Triage Notes (Signed)
EMS reports from home, called out for seizure, girlfriend said x 4 lasting 45 seconds. Hx of Epilepsy. Pt arrived Post-Ictal.  BP 128/90 HR 88 RR 16 Sp02 96 RA CBG 147 Temp 99.4

## 2022-03-17 ENCOUNTER — Other Ambulatory Visit: Payer: Self-pay

## 2022-03-17 ENCOUNTER — Emergency Department (HOSPITAL_COMMUNITY)
Admission: EM | Admit: 2022-03-17 | Discharge: 2022-03-17 | Disposition: A | Discharge status: Home / Self Care | Payer: No Typology Code available for payment source | Attending: Emergency Medicine | Admitting: Emergency Medicine

## 2022-03-17 ENCOUNTER — Emergency Department (HOSPITAL_COMMUNITY): Payer: No Typology Code available for payment source

## 2022-03-17 ENCOUNTER — Encounter (HOSPITAL_COMMUNITY): Payer: Self-pay

## 2022-03-17 DIAGNOSIS — S0990XA Unspecified injury of head, initial encounter: Secondary | ICD-10-CM | POA: Diagnosis not present

## 2022-03-17 DIAGNOSIS — G40909 Epilepsy, unspecified, not intractable, without status epilepticus: Secondary | ICD-10-CM | POA: Diagnosis not present

## 2022-03-17 DIAGNOSIS — Z20822 Contact with and (suspected) exposure to covid-19: Secondary | ICD-10-CM | POA: Diagnosis not present

## 2022-03-17 DIAGNOSIS — W19XXXA Unspecified fall, initial encounter: Secondary | ICD-10-CM | POA: Diagnosis not present

## 2022-03-17 DIAGNOSIS — S161XXA Strain of muscle, fascia and tendon at neck level, initial encounter: Secondary | ICD-10-CM | POA: Insufficient documentation

## 2022-03-17 DIAGNOSIS — R569 Unspecified convulsions: Secondary | ICD-10-CM

## 2022-03-17 DIAGNOSIS — S199XXA Unspecified injury of neck, initial encounter: Secondary | ICD-10-CM | POA: Diagnosis present

## 2022-03-17 LAB — BASIC METABOLIC PANEL
Anion gap: 11 (ref 5–15)
BUN: 8 mg/dL (ref 6–20)
CO2: 25 mmol/L (ref 22–32)
Calcium: 9.2 mg/dL (ref 8.9–10.3)
Chloride: 102 mmol/L (ref 98–111)
Creatinine, Ser: 1.2 mg/dL (ref 0.61–1.24)
GFR, Estimated: >60 mL/min (ref >60)
Glucose, Bld: 108 mg/dL — ABNORMAL HIGH (ref 70–99)
Potassium: 4 mmol/L (ref 3.5–5.1)
Sodium: 138 mmol/L (ref 135–145)

## 2022-03-17 LAB — HEPATIC FUNCTION PANEL
ALT: 42 U/L (ref 0–44)
AST: 41 U/L (ref 15–41)
Albumin: 3.6 g/dL (ref 3.5–5.0)
Alkaline Phosphatase: 47 U/L (ref 38–126)
Bilirubin, Direct: 0.1 mg/dL (ref 0.0–0.2)
Indirect Bilirubin: 0.2 mg/dL — ABNORMAL LOW (ref 0.3–0.9)
Total Bilirubin: 0.3 mg/dL (ref 0.3–1.2)
Total Protein: 7.2 g/dL (ref 6.5–8.1)

## 2022-03-17 LAB — RESP PANEL BY RT-PCR (FLU A&B, COVID) ARPGX2
Influenza A by PCR: NEGATIVE
Influenza B by PCR: NEGATIVE
SARS Coronavirus 2 by RT PCR: NEGATIVE

## 2022-03-17 LAB — VALPROIC ACID LEVEL: Valproic Acid Lvl: 62 ug/mL (ref 50.0–100.0)

## 2022-03-17 LAB — CBC WITH DIFFERENTIAL/PLATELET
Abs Immature Granulocytes: 0.04 10*3/uL (ref 0.00–0.07)
Basophils Absolute: 0 10*3/uL (ref 0.0–0.1)
Basophils Relative: 0 %
Eosinophils Absolute: 0 10*3/uL (ref 0.0–0.5)
Eosinophils Relative: 0 %
HCT: 40 % (ref 39.0–52.0)
Hemoglobin: 12.5 g/dL — ABNORMAL LOW (ref 13.0–17.0)
Immature Granulocytes: 1 %
Lymphocytes Relative: 10 %
Lymphs Abs: 0.7 10*3/uL (ref 0.7–4.0)
MCH: 23.6 pg — ABNORMAL LOW (ref 26.0–34.0)
MCHC: 31.3 g/dL (ref 30.0–36.0)
MCV: 75.6 fL — ABNORMAL LOW (ref 80.0–100.0)
Monocytes Absolute: 0.4 10*3/uL (ref 0.1–1.0)
Monocytes Relative: 6 %
Neutro Abs: 6.2 10*3/uL (ref 1.7–7.7)
Neutrophils Relative %: 83 %
Platelets: 201 10*3/uL (ref 150–400)
RBC: 5.29 MIL/uL (ref 4.22–5.81)
RDW: 14.9 % (ref 11.5–15.5)
WBC: 7.5 10*3/uL (ref 4.0–10.5)
nRBC: 0 % (ref 0.0–0.2)

## 2022-03-17 LAB — RAPID URINE DRUG SCREEN, HOSP PERFORMED
Amphetamines: NOT DETECTED
Barbiturates: NOT DETECTED
Benzodiazepines: NOT DETECTED
Cocaine: NOT DETECTED
Opiates: NOT DETECTED
Tetrahydrocannabinol: POSITIVE — AB

## 2022-03-17 MED ORDER — SODIUM CHLORIDE 0.9 % IV SOLN
200.0000 mg | Freq: Once | INTRAVENOUS | Status: AC
  Administered 2022-03-17: 200 mg via INTRAVENOUS
  Filled 2022-03-17: qty 20

## 2022-03-17 MED ORDER — LACOSAMIDE 50 MG PO TABS
50.0000 mg | ORAL_TABLET | Freq: Two times a day (BID) | ORAL | 3 refills | Status: AC
Start: 2022-03-17 — End: 2022-04-16

## 2022-03-17 MED ORDER — SODIUM CHLORIDE 0.9 % IV BOLUS
1000.0000 mL | Freq: Once | INTRAVENOUS | Status: AC
Start: 2022-03-17 — End: 2022-03-17
  Administered 2022-03-17: 1000 mL via INTRAVENOUS

## 2022-03-17 MED ORDER — ACETAMINOPHEN 500 MG PO TABS
1000.0000 mg | ORAL_TABLET | Freq: Once | ORAL | Status: AC
  Administered 2022-03-17: 1000 mg via ORAL
  Filled 2022-03-17: qty 2

## 2022-03-17 MED ORDER — FENTANYL CITRATE PF 50 MCG/ML IJ SOSY
50.0000 ug | PREFILLED_SYRINGE | Freq: Once | INTRAMUSCULAR | Status: AC
  Administered 2022-03-17: 50 ug via INTRAVENOUS
  Filled 2022-03-17: qty 1

## 2022-03-17 NOTE — Discharge Instructions (Signed)
Please take both your Vimpat and Depakote as directed.  Follow-up with your neurologist for any new concerns.  No driving or operating machinery or swimming until cleared by neurologist. ?Take Tylenol every 4 as needed for pain. ?

## 2022-03-17 NOTE — ED Provider Notes (Signed)
?[facility] EMERGENCY DEPARTMENT ?Provider Note ? ? ?CSN: [id number] ?Arrival date & time: 03/17/22  1833 ? ?  ? ?History ? ?Chief Complaint  ?Patient presents with  ? Seizures  ?  Pt had witnessed seizure with fall and head injury, and 2 back to back once on floor. Total of 3 seizures described as tonic clonic of arms.  ? ? ?Riley Francis is a 26 y.o. male. ? ?Patient presents for assessment of head and neck injury after witnessed seizure prior to arrival.  Patient had 3 generalized seizures tonic-clonic leading to him falling hitting his head and he has neck pain as well.  No weakness or numbness in arms or legs.  Patient had multiple seizures in the past for many years.  Patient says that he is compliant with valproic acid daily.  Patient has outpatient neurologist forgets the name.  Patient has headache since the event.  No fevers chills or respiratory symptoms.  Patient said he vomited prior to seizure.  Patient does not recall details. ? ? ?  ? ?Home Medications ?Prior to Admission medications   ?Medication Sig Start Date End Date Taking? Authorizing Provider  ?divalproex (DEPAKOTE ER) 500 MG 24 hr tablet Take 500 mg by mouth in the morning and at bedtime.   Yes [provider]  ?lacosamide (VIMPAT) 50 MG TABS tablet Take 1 tablet (50 mg total) by mouth 2 (two) times daily. 01/09/22  Yes Fayrene Helper, PA-C  ?lacosamide (VIMPAT) 50 MG TABS tablet Take 1 tablet (50 mg total) by mouth 2 (two) times daily. 03/17/22 04/16/22 Yes Blane Ohara, MD  ?   ? ?Allergies    ?Patient has no known allergies.   ? ?Review of Systems   ?Review of Systems  ?Constitutional:  Positive for fatigue. Negative for chills and fever.  ?HENT:  Negative for congestion.   ?Eyes:  Negative for visual disturbance.  ?Respiratory:  Negative for shortness of breath.   ?Cardiovascular:  Negative for chest pain.  ?Gastrointestinal:  Negative for abdominal pain and vomiting.  ?Genitourinary:  Negative for dysuria and flank  pain.  ?Musculoskeletal:  Negative for back pain, neck pain and neck stiffness.  ?Skin:  Negative for rash.  ?Neurological:  Positive for seizures and headaches. Negative for light-headedness.  ? ?Physical Exam ?Updated Vital Signs ?BP 113/68   Pulse 78   Temp 100.2 ?F (37.9 ?C) (Oral)   Resp 16   Ht 5\' 9"  (1.753 m)   Wt 75.8 kg   SpO2 98%   BMI 24.66 kg/m?  ?Physical Exam ?Vitals and nursing note reviewed.  ?Constitutional:   ?   General: He is not in acute distress. ?   Appearance: He is well-developed.  ?HENT:  ?   Head: Normocephalic.  ?   Comments: Patient has c-collar in place, no significant scalp hematoma. ?   Mouth/Throat:  ?   Mouth: Mucous membranes are moist.  ?Eyes:  ?   General:     ?   Right eye: No discharge.     ?   Left eye: No discharge.  ?   Conjunctiva/sclera: Conjunctivae normal.  ?Neck:  ?   Trachea: No tracheal deviation.  ?Cardiovascular:  ?   Rate and Rhythm: Normal rate and regular rhythm.  ?Pulmonary:  ?   Effort: Pulmonary effort is normal.  ?   Breath sounds: Normal breath sounds.  ?Abdominal:  ?   General: There is no distension.  ?   Palpations: Abdomen is soft.  ?  Tenderness: There is no abdominal tenderness. There is no guarding.  ?Musculoskeletal:     ?   General: Tenderness present. No swelling.  ?   Cervical back: Normal range of motion and neck supple. No rigidity.  ?   Comments: Patient denies significant pain in all extremities with flexion extension, normal strength bilateral.  Gross sensation intact palpation bilateral arms and legs.  No midline thoracic or lumbar tenderness no step-off.  ?Skin: ?   General: Skin is warm.  ?   Capillary Refill: Capillary refill takes less than 2 seconds.  ?   Findings: No rash.  ?Neurological:  ?   General: No focal deficit present.  ?   Mental Status: He is alert.  ?   Cranial Nerves: No cranial nerve deficit.  ?Psychiatric:  ?   Comments: Patient uncomfortable due to headache, mild sedate but awakens and discusses with normal  verbal discussion.  ? ? ?ED Results / Procedures / Treatments   ?Labs ?(all labs ordered are listed, but only abnormal results are displayed) ?Labs Reviewed  ?CBC WITH DIFFERENTIAL/PLATELET - Abnormal; Notable for the following components:  ?    Result Value  ? Hemoglobin 12.5 (*)   ? MCV 75.6 (*)   ? MCH 23.6 (*)   ? All other components within normal limits  ?BASIC METABOLIC PANEL - Abnormal; Notable for the following components:  ? Glucose, Bld 108 (*)   ? All other components within normal limits  ?HEPATIC FUNCTION PANEL - Abnormal; Notable for the following components:  ? Indirect Bilirubin 0.2 (*)   ? All other components within normal limits  ?RAPID URINE DRUG SCREEN, HOSP PERFORMED - Abnormal; Notable for the following components:  ? Tetrahydrocannabinol POSITIVE (*)   ? All other components within normal limits  ?RESP PANEL BY RT-PCR (FLU A&B, COVID) ARPGX2  ?VALPROIC ACID LEVEL  ?CBG MONITORING, ED  ? ? ?EKG ?EKG Interpretation ? ?Date/Time:  Monday March 17 2022 19:25:21 EDT ?Ventricular Rate:  84 ?PR Interval:  204 ?QRS Duration: 87 ?QT Interval:  315 ?QTC Calculation: 373 ?R Axis:   -79 ?Text Interpretation: Sinus rhythm Borderline prolonged PR interval Left axis deviation ST elev, probable normal early repol pattern Confirmed by Blane OharaZavitz, Markanthony [phone]([id number]) on 03/17/2022 7:37:17 PM ? ?Radiology ?CT Head Wo Contrast ? ?Result Date: 03/17/2022 ?FALL CLINICAL DATA: Fall, Head injury, seizure, loss of consciousness EXAM: CT HEAD WITHOUT CONTRAST CT CERVICAL SPINE WITHOUT CONTRAST TECHNIQUE: Multidetector CT imaging of the head and cervical spine was performed following the standard protocol without intravenous contrast. Multiplanar CT image reconstructions of the cervical spine were also generated. RADIATION DOSE REDUCTION: This exam was performed according to the departmental dose-optimization program which includes automated exposure control, adjustment of the mA and/or kV according to patient size and/or use of  iterative reconstruction technique. COMPARISON:  None. FINDINGS: CT HEAD FINDINGS Brain: Normal anatomic configuration. No abnormal intra or extra-axial mass lesion or fluid collection. No abnormal mass effect or midline shift. No evidence of acute intracranial hemorrhage or infarct. Ventricular size is normal. Cerebellum unremarkable. Vascular: Unremarkable Skull: Intact Sinuses/Orbits: Mild mucosal thickening within the right maxillary sinus. Remaining paranasal sinuses are clear. Orbits are unremarkable. Other: Mastoid air cells and middle ear cavities are clear. CT CERVICAL SPINE FINDINGS Alignment: Normal. Skull base and vertebrae: No acute fracture. No primary bone lesion or focal pathologic process. Soft tissues and spinal canal: No prevertebral fluid or swelling. No visible canal hematoma. Disc levels: Intervertebral disc height and vertebral body heights  are preserved. Prevertebral soft tissues are not thickened on sagittal reformats. Spinal canal is widely patent. No significant neuroforaminal narrowing. Upper chest: Negative. Other: None IMPRESSION: No acute intracranial abnormality.  No calvarial fracture. No acute fracture or listhesis of the cervical spine. Electronically Signed   By: Helyn Numbers M.D.   On: 03/17/2022 22:23  ? ?CT Cervical Spine Wo Contrast ? ?Result Date: 03/17/2022 ?FALL CLINICAL DATA: Fall, Head injury, seizure, loss of consciousness EXAM: CT HEAD WITHOUT CONTRAST CT CERVICAL SPINE WITHOUT CONTRAST TECHNIQUE: Multidetector CT imaging of the head and cervical spine was performed following the standard protocol without intravenous contrast. Multiplanar CT image reconstructions of the cervical spine were also generated. RADIATION DOSE REDUCTION: This exam was performed according to the departmental dose-optimization program which includes automated exposure control, adjustment of the mA and/or kV according to patient size and/or use of iterative reconstruction technique. COMPARISON:   None. FINDINGS: CT HEAD FINDINGS Brain: Normal anatomic configuration. No abnormal intra or extra-axial mass lesion or fluid collection. No abnormal mass effect or midline shift. No evidence of acute intracranial he

## 2022-03-17 NOTE — ED Notes (Signed)
Riley Francis, wife, [phone] would like an update on pt  ?

## 2022-03-17 NOTE — ED Triage Notes (Addendum)
Pt had witnessed seizure with fall and head injury, and 2 back to back once on floor. Total of 3 seizures described as tonic clonic of arms. Arrives awake/alert oriented x 3 after a post ictal period. ?

## 2022-06-30 ENCOUNTER — Encounter (HOSPITAL_COMMUNITY): Payer: Self-pay

## 2022-06-30 ENCOUNTER — Other Ambulatory Visit: Payer: Self-pay

## 2022-06-30 ENCOUNTER — Emergency Department (HOSPITAL_COMMUNITY)
Admission: EM | Admit: 2022-06-30 | Discharge: 2022-06-30 | Disposition: A | Discharge status: Home / Self Care | Payer: No Typology Code available for payment source | Attending: Emergency Medicine | Admitting: Emergency Medicine

## 2022-06-30 ENCOUNTER — Emergency Department (HOSPITAL_COMMUNITY): Payer: No Typology Code available for payment source

## 2022-06-30 DIAGNOSIS — S0181XA Laceration without foreign body of other part of head, initial encounter: Secondary | ICD-10-CM | POA: Insufficient documentation

## 2022-06-30 DIAGNOSIS — M542 Cervicalgia: Secondary | ICD-10-CM | POA: Insufficient documentation

## 2022-06-30 DIAGNOSIS — M545 Low back pain, unspecified: Secondary | ICD-10-CM | POA: Insufficient documentation

## 2022-06-30 DIAGNOSIS — S01111A Laceration without foreign body of right eyelid and periocular area, initial encounter: Secondary | ICD-10-CM | POA: Insufficient documentation

## 2022-06-30 DIAGNOSIS — R569 Unspecified convulsions: Secondary | ICD-10-CM | POA: Insufficient documentation

## 2022-06-30 DIAGNOSIS — W108XXA Fall (on) (from) other stairs and steps, initial encounter: Secondary | ICD-10-CM | POA: Insufficient documentation

## 2022-06-30 DIAGNOSIS — Z23 Encounter for immunization: Secondary | ICD-10-CM | POA: Diagnosis not present

## 2022-06-30 DIAGNOSIS — S0993XA Unspecified injury of face, initial encounter: Secondary | ICD-10-CM | POA: Diagnosis present

## 2022-06-30 DIAGNOSIS — W19XXXA Unspecified fall, initial encounter: Secondary | ICD-10-CM

## 2022-06-30 DIAGNOSIS — Y92003 Bedroom of unspecified non-institutional (private) residence as the place of occurrence of the external cause: Secondary | ICD-10-CM | POA: Insufficient documentation

## 2022-06-30 LAB — COMPREHENSIVE METABOLIC PANEL
ALT: 22 U/L (ref 0–44)
AST: 26 U/L (ref 15–41)
Albumin: 3.9 g/dL (ref 3.5–5.0)
Alkaline Phosphatase: 49 U/L (ref 38–126)
Anion gap: 11 (ref 5–15)
BUN: 7 mg/dL (ref 6–20)
CO2: 23 mmol/L (ref 22–32)
Calcium: 9.4 mg/dL (ref 8.9–10.3)
Chloride: 104 mmol/L (ref 98–111)
Creatinine, Ser: 1.03 mg/dL (ref 0.61–1.24)
GFR, Estimated: >60 mL/min (ref >60)
Glucose, Bld: 102 mg/dL — ABNORMAL HIGH (ref 70–99)
Potassium: 3.7 mmol/L (ref 3.5–5.1)
Sodium: 138 mmol/L (ref 135–145)
Total Bilirubin: 0.6 mg/dL (ref 0.3–1.2)
Total Protein: 7.4 g/dL (ref 6.5–8.1)

## 2022-06-30 LAB — I-STAT CHEM 8, ED
BUN: 7 mg/dL (ref 6–20)
Calcium, Ion: 1.06 mmol/L — ABNORMAL LOW (ref 1.15–1.40)
Chloride: 102 mmol/L (ref 98–111)
Creatinine, Ser: 1 mg/dL (ref 0.61–1.24)
Glucose, Bld: 109 mg/dL — ABNORMAL HIGH (ref 70–99)
HCT: 40 % (ref 39.0–52.0)
Hemoglobin: 13.6 g/dL (ref 13.0–17.0)
Potassium: 4.3 mmol/L (ref 3.5–5.1)
Sodium: 137 mmol/L (ref 135–145)
TCO2: 22 mmol/L (ref 22–32)

## 2022-06-30 LAB — CBC
HCT: 42.9 % (ref 39.0–52.0)
Hemoglobin: 13 g/dL (ref 13.0–17.0)
MCH: 23.1 pg — ABNORMAL LOW (ref 26.0–34.0)
MCHC: 30.3 g/dL (ref 30.0–36.0)
MCV: 76.3 fL — ABNORMAL LOW (ref 80.0–100.0)
Platelets: 134 10*3/uL — ABNORMAL LOW (ref 150–400)
RBC: 5.62 MIL/uL (ref 4.22–5.81)
RDW: 16.4 % — ABNORMAL HIGH (ref 11.5–15.5)
WBC: 5.9 10*3/uL (ref 4.0–10.5)
nRBC: 0 % (ref 0.0–0.2)

## 2022-06-30 LAB — ETHANOL: Alcohol, Ethyl (B): <10 mg/dL (ref <10)

## 2022-06-30 LAB — VALPROIC ACID LEVEL: Valproic Acid Lvl: 59 ug/mL (ref 50.0–100.0)

## 2022-06-30 MED ORDER — FENTANYL CITRATE PF 50 MCG/ML IJ SOSY
50.0000 ug | PREFILLED_SYRINGE | Freq: Once | INTRAMUSCULAR | Status: AC
  Administered 2022-06-30: 50 ug via INTRAVENOUS
  Filled 2022-06-30: qty 1

## 2022-06-30 MED ORDER — VALPROATE SODIUM 100 MG/ML IV SOLN
1000.0000 mg | Freq: Once | INTRAVENOUS | Status: AC
  Administered 2022-06-30: 1000 mg via INTRAVENOUS
  Filled 2022-06-30: qty 10

## 2022-06-30 MED ORDER — IOHEXOL 300 MG/ML  SOLN
100.0000 mL | Freq: Once | INTRAMUSCULAR | Status: AC | PRN
  Administered 2022-06-30: 100 mL via INTRAVENOUS

## 2022-06-30 MED ORDER — ONDANSETRON HCL 4 MG/2ML IJ SOLN
4.0000 mg | Freq: Once | INTRAMUSCULAR | Status: AC
Start: 2022-06-30 — End: 2022-06-30
  Administered 2022-06-30: 4 mg via INTRAVENOUS
  Filled 2022-06-30: qty 2

## 2022-06-30 MED ORDER — TETANUS-DIPHTH-ACELL PERTUSSIS 5-2.5-18.5 LF-MCG/0.5 IM SUSY
0.5000 mL | PREFILLED_SYRINGE | Freq: Once | INTRAMUSCULAR | Status: AC
  Administered 2022-06-30: 0.5 mL via INTRAMUSCULAR
  Filled 2022-06-30: qty 0.5

## 2022-06-30 MED ORDER — LIDOCAINE HCL 2 % IJ SOLN
10.0000 mL | Freq: Once | INTRAMUSCULAR | Status: AC
  Administered 2022-06-30: 200 mg via INTRADERMAL
  Filled 2022-06-30: qty 20

## 2022-06-30 MED ORDER — SODIUM CHLORIDE 0.9 % IV SOLN
50.0000 mg | Freq: Once | INTRAVENOUS | Status: AC
  Administered 2022-06-30: 50 mg via INTRAVENOUS
  Filled 2022-06-30: qty 5

## 2022-06-30 NOTE — ED Notes (Signed)
Checked on istat status. Lab is rerunning the istat.

## 2022-06-30 NOTE — ED Triage Notes (Signed)
Pt arrived to ED via EMS from home w/ c/o seizure and Level 2 fall down stairs. Pt was reported to have been standing at the top of the staircase when he had LOC and fell down the flight of stairs where he then had a seizure. Pt reports he missed a dose of his seizure meds yesterday. EMS reports pt was postictal upon arrival. C-collar in place, 18g L AC. Pt w/ 2cm lac to R eyebrow, abrasions to forehead and posterior head. C/O lumbar spine pain upon palpation. Reported GCS 14. Pt GCS upon arrival to ED is 15. Seizure precautions initated.

## 2022-06-30 NOTE — ED Notes (Signed)
Pt back in ED room from CT °

## 2022-06-30 NOTE — ED Notes (Signed)
Attempted to call CT x 1

## 2022-06-30 NOTE — Discharge Instructions (Signed)
He was seen in the emergency department for evaluation following a fall and seizure.  Your right eyebrow was repaired in the emergency department.  Your CT scans showed a nasal bone fracture.  The number listed above is for you to follow-up within the next 2 to 3 days for recheck of your nasal bone fracture.  You were loaded with antiseizure medications here in the emergency department.  We recommend continuing your at home p.o. antiseizure medications.  We recommend following up with your primary care provider as well in the coming days for symptom recheck.

## 2022-06-30 NOTE — ED Provider Notes (Signed)
[facility] EMERGENCY DEPARTMENT Provider Note   CSN: [id number] Arrival date & time: 06/30/22  1615     History {Add pertinent medical, surgical, social history, OB history to HPI:1} No chief complaint on file.   Riley Francis is a 26 y.o. male.  HPI  Patient is a 26 year old male with a history of alcohol use disorder, anxiety, bipolar disorder, PTSD, TBI, migraines, seizures who presents emergency department as a level 2 trauma for evaluation of a fall and seizure.  Patient reports that he did not take his morning seizure medications.  He currently takes valproate 1500 mg daily and Vimpat 50 mg twice daily.  According the patient the last thing that he remembers was walking upstairs into his bedroom.  According to EMS the patient had 1 seizure which caused him to fall down the stairs, he subsequently then had another seizure after he hit the ground.  Patient was postictal upon EMS arrival and he is still somewhat somnolent but more or less back to his baseline.  Patient now reports that he has pain over the bridge of his nose and over his forehead.  He reports some generalized fatigue and has some tenderness in the back of his neck.  He denies any abdominal pain, chest pain, shortness of breath.  He denies any recent fevers or chills at home.     Home Medications Prior to Admission medications   Medication Sig Start Date End Date Taking? Authorizing Provider  divalproex (DEPAKOTE ER) 500 MG 24 hr tablet Take 500 mg by mouth in the morning and at bedtime.    [provider]  lacosamide (VIMPAT) 50 MG TABS tablet Take 1 tablet (50 mg total) by mouth 2 (two) times daily. 01/09/22   Fayrene Helper, PA-C  lacosamide (VIMPAT) 50 MG TABS tablet Take 1 tablet (50 mg total) by mouth 2 (two) times daily. 03/17/22 04/16/22  Blane Ohara, MD      Allergies    Patient has no known allergies.    Review of Systems   Review of Systems  Physical Exam Updated Vital Signs BP  122/64   Temp 98.4 F (36.9 C)  Physical Exam Vitals and nursing note reviewed.  Constitutional:      General: He is not in acute distress.    Appearance: He is well-developed.  HENT:     Head:     Comments: Small hematoma over the patient's forehead, 1-1/2 x 1 cm.  2 cm laceration over the patient's lateral right eyebrow.    Right Ear: External ear normal.     Left Ear: External ear normal.     Nose: Nose normal.     Comments: No nasal septal hematoma    Mouth/Throat:     Mouth: Mucous membranes are moist.     Pharynx: Oropharynx is clear.     Comments: No evidence of blood within the oropharynx.  No loose teeth.  Jaw aligns appropriately. Eyes:     Extraocular Movements: Extraocular movements intact.     Conjunctiva/sclera: Conjunctivae normal.     Pupils: Pupils are equal, round, and reactive to light.  Neck:     Comments: Cervical collar in place.  Midline C-spine tenderness around the area of C6/7 Cardiovascular:     Rate and Rhythm: Normal rate and regular rhythm.     Heart sounds: No murmur heard. Pulmonary:     Effort: Pulmonary effort is normal. No respiratory distress.     Breath sounds: Normal breath sounds. No  wheezing, rhonchi or rales.  Abdominal:     Palpations: Abdomen is soft.     Tenderness: There is no abdominal tenderness. There is no guarding or rebound.  Musculoskeletal:        General: Tenderness present. No swelling.     Cervical back: Neck supple.     Comments: Mild tenderness palpation over the T12/L1 area of his midline spine.  Skin:    General: Skin is warm and dry.     Capillary Refill: Capillary refill takes less than 2 seconds.  Neurological:     General: No focal deficit present.     Mental Status: He is alert and oriented to person, place, and time.     GCS: GCS eye subscore is 4. GCS verbal subscore is 5. GCS motor subscore is 6.     Cranial Nerves: No facial asymmetry.     Motor: Motor function is intact. No weakness, tremor or seizure  activity.     Coordination: Finger-Nose-Finger Test and Heel to [employer] normal.  Psychiatric:        Mood and Affect: Mood normal.     ED Results / Procedures / Treatments   Labs (all labs ordered are listed, but only abnormal results are displayed) Labs Reviewed - No data to display  EKG None  Radiology No results found.  Procedures .Marland KitchenLaceration Repair  Date/Time: 06/30/2022 6:12 PM  Performed by: Tommie Raymond, MD Authorized by: Charlynne Pander, MD   Consent:    Consent obtained:  Verbal   Consent given by:  Patient   {Document cardiac monitor, telemetry assessment procedure when appropriate:1}  Medications Ordered in ED Medications - No data to display  ED Course/ Medical Decision Making/ A&P                           Medical Decision Making Problems Addressed: Facial laceration, initial encounter: complicated acute illness or injury Fall, initial encounter: complicated acute illness or injury with systemic symptoms that poses a threat to life or bodily functions Seizure [facility]): complicated acute illness or injury with systemic symptoms that poses a threat to life or bodily functions  Amount and/or Complexity of Data Reviewed Labs: ordered. Radiology: ordered. ECG/medicine tests: ordered.  Risk Prescription drug management.   Patient is a 26 year old male who presents emergency department as above.  On initial presentation patient's vital signs are stable and he is afebrile.  Physical exam does reveal some lacerations over the patient's nose and a hematoma over his forehead.  He has a small abrasion over the back of his head as well.  He does have some midline C-spine tenderness as well as some lower thoracic spine tenderness.  Patient is moving all his extremities appropriately and is GCS 15.  Patient denied taking his morning doses of his home Vimpat and Depakote for his underlying history of seizures.  For this reason suspicion is highest for seizure  secondary to medication noncompliance resulting in a fall from height. Baseline laboratory work as well as full trauma scans will be ordered for this patient.  We will also order a valproic level as well as a UDS.    {Document critical care time when appropriate:1} {Document review of labs and clinical decision tools ie heart score, Chads2Vasc2 etc:1}  {Document your independent review of radiology images, and any outside records:1} {Document your discussion with family members, caretakers, and with consultants:1} {Document social determinants of health affecting pt's care:1} {Document your  decision making why or why not admission, treatments were needed:1} Final Clinical Impression(s) / ED Diagnoses Final diagnoses:  None    Rx / DC Orders ED Discharge Orders     None

## 2022-06-30 NOTE — ED Notes (Signed)
Attempted to call CT x 2 and was told pt isn't ready d/t no IV. Explained that pt has an IV. They then said "No labs, no IV, so no we aren't ready for him".

## 2022-06-30 NOTE — ED Notes (Signed)
Called CT x3. They will call this RN back when they are ready for the pt

## 2022-06-30 NOTE — ED Notes (Signed)
Pt transported to CT 1 at this time. 

## 2023-02-05 IMAGING — CT CT HEAD W/O CM
4 series · 16 of 47 positions shown, 18 images · non-contrast
Comparison: None.

FALL CLINICAL DATA:
Fall, Head injury, seizure, loss of consciousness



[Series 3: head wo · axial · 0.47mm/px · z∈[+264,+384]mm · 7 of 32 slices shown, 9 images]
[im 4/32  brain]
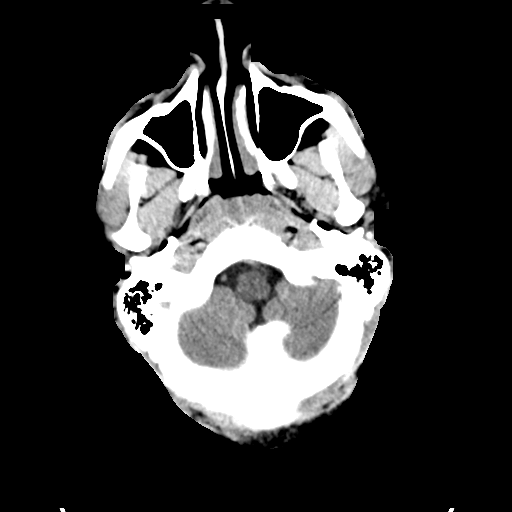
[im 4/32  bone]
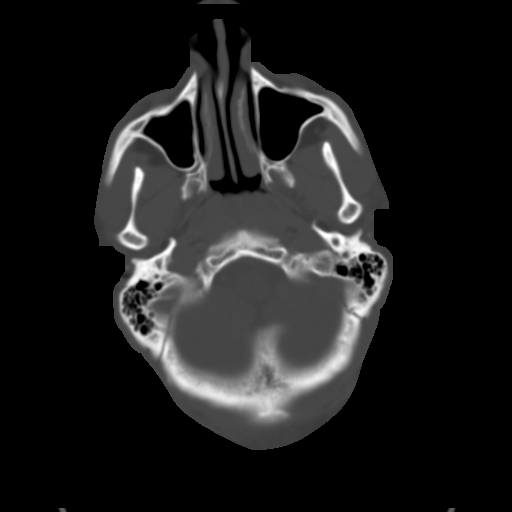
[im 8/32  brain]
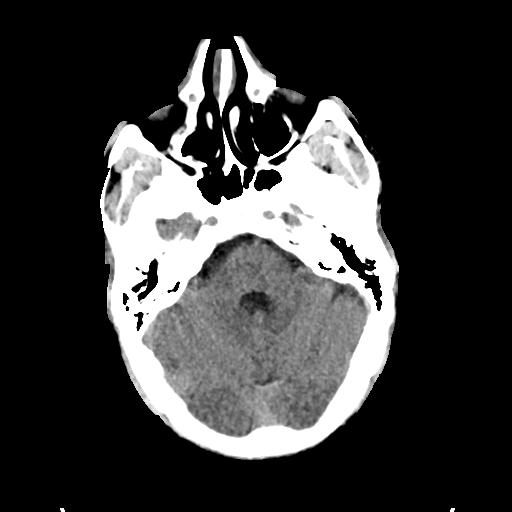
[im 12/32  brain]
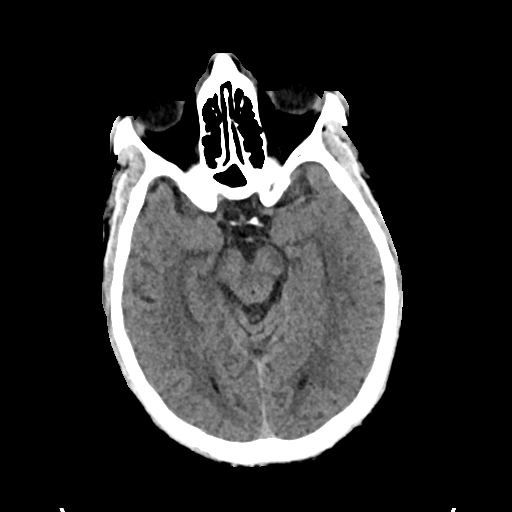
[im 16/32  brain]
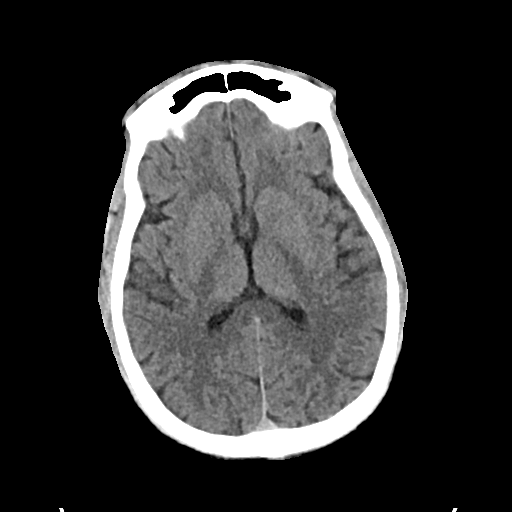
[im 20/32  brain]
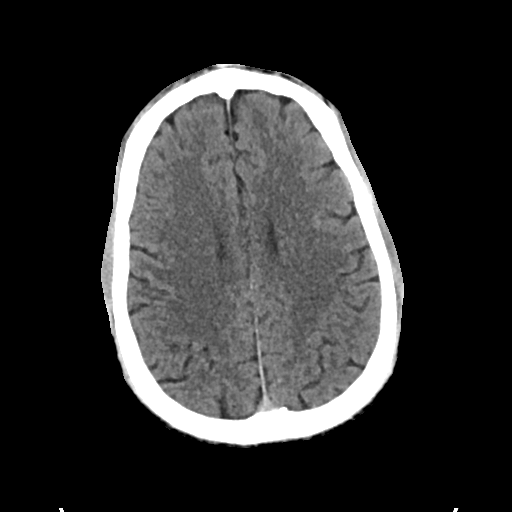
[im 20/32  bone]
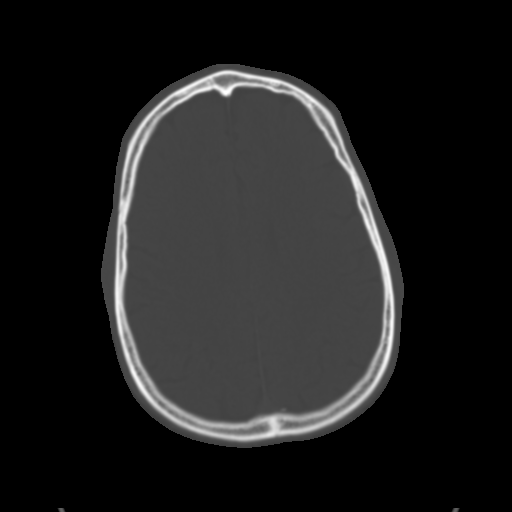
[im 24/32  brain]
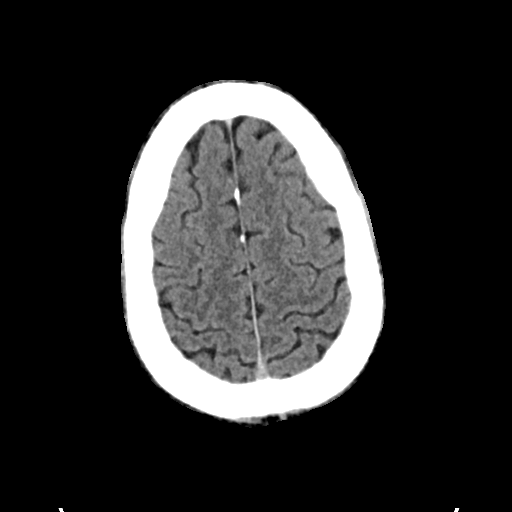
[im 28/32  brain]
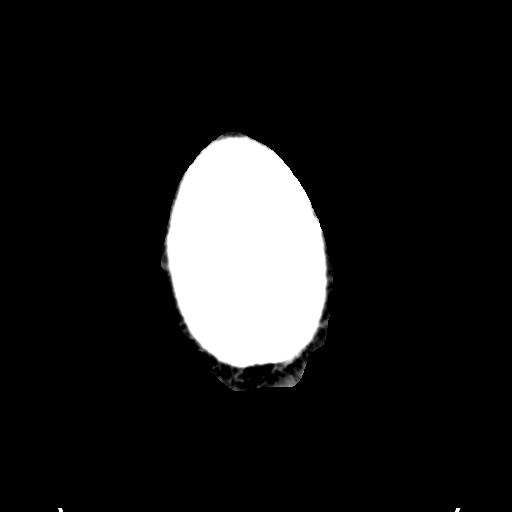

[Series 4: head bone · axial · 0.47mm/px · z∈[+263,+295]mm · 3 of 80 slices shown]
[im 8/80  bone]
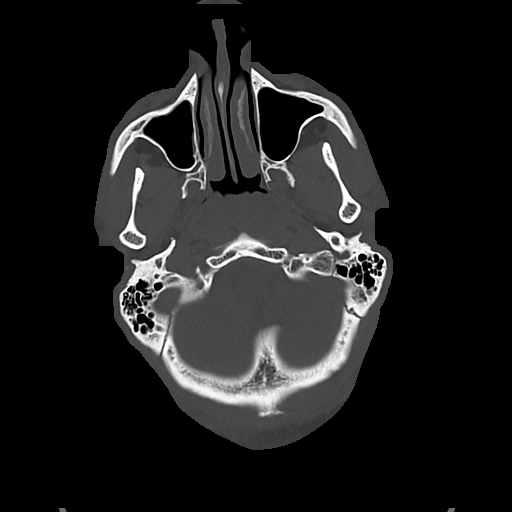
[im 16/80  bone]
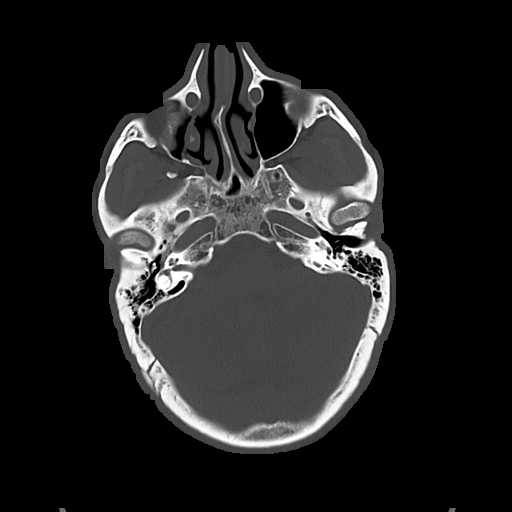
[im 24/80  bone]
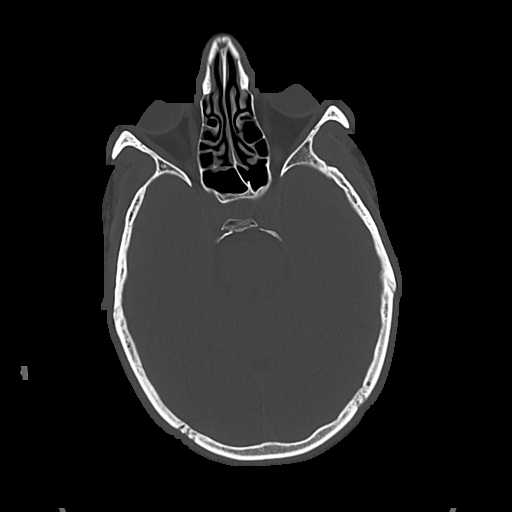

[Series 5: cor soft · coronal · 0.33mm/px · 3 of 68 slices shown]
[im 23/68  brain]
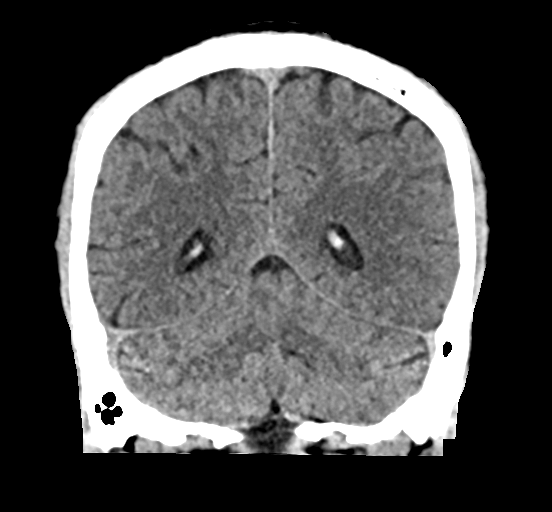
[im 30/68  brain]
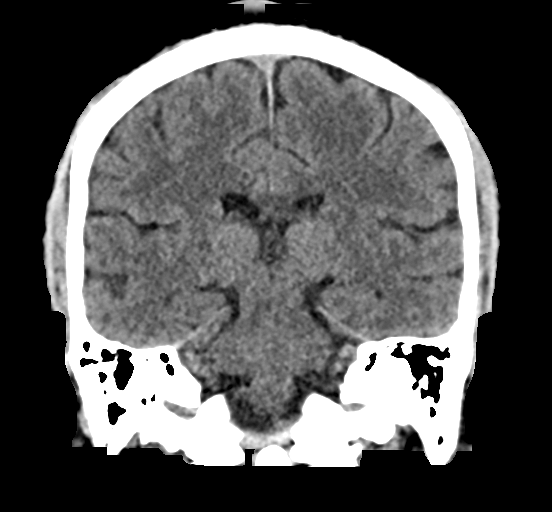
[im 38/68  brain]
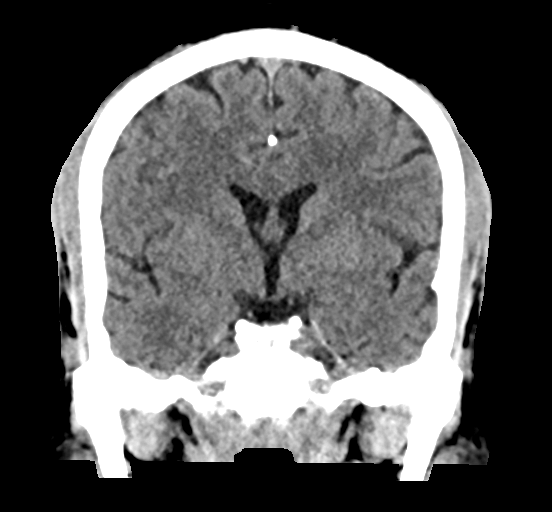

[Series 6: sag soft · sagittal · 0.33mm/px · 3 of 57 slices shown]
[im 19/57  brain]
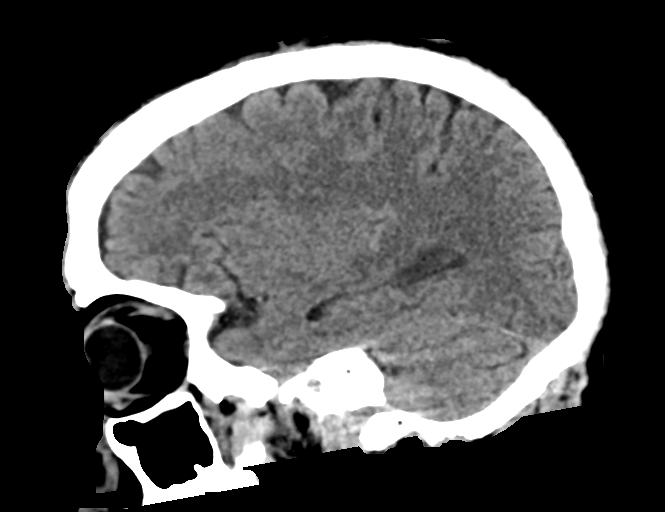
[im 29/57  brain]
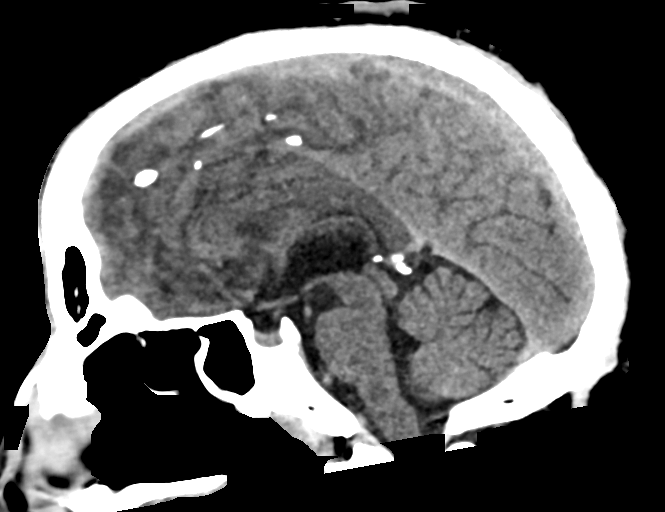
[im 38/57  brain]
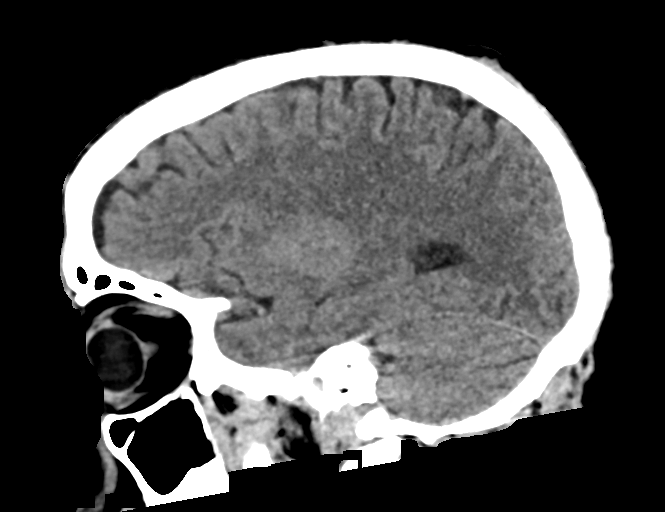

[16 of 47 positions shown; findings below may reference images not displayed]

FINDINGS: CT HEAD FINDINGS

Brain: Normal anatomic configuration. No abnormal intra or
extra-axial mass lesion or fluid collection. No abnormal mass effect
or midline shift. No evidence of acute intracranial hemorrhage or
infarct. Ventricular size is normal. Cerebellum unremarkable.

Vascular: Unremarkable

Skull: Intact

Sinuses/Orbits: Mild mucosal thickening within the right maxillary
sinus. Remaining paranasal sinuses are clear. Orbits are
unremarkable.

Other: Mastoid air cells and middle ear cavities are clear.

CT CERVICAL SPINE FINDINGS

Alignment: Normal.

Skull base and vertebrae: No acute fracture. No primary bone lesion
or focal pathologic process.

Soft tissues and spinal canal: No prevertebral fluid or swelling. No
visible canal hematoma.

Disc levels: Intervertebral disc height and vertebral body heights
are preserved. Prevertebral soft tissues are not thickened on
sagittal reformats. Spinal canal is widely patent. No significant
neuroforaminal narrowing.

Upper chest: Negative.

Other: None
IMPRESSION: No acute intracranial abnormality.  No calvarial fracture.

No acute fracture or listhesis of the cervical spine.
